# Patient Record
Sex: Female | Born: 1995 | Race: Black or African American | Hispanic: No | Marital: Single | Smoking: Former smoker
Health system: Southern US, Community
[De-identification: ages and names within clinical notes are randomized; demographics above are authoritative.]

## PROBLEM LIST (undated history)

## (undated) ENCOUNTER — Inpatient Hospital Stay (HOSPITAL_COMMUNITY): Payer: Self-pay

## (undated) ENCOUNTER — Ambulatory Visit: Admission: EM | Payer: Self-pay

## (undated) DIAGNOSIS — F419 Anxiety disorder, unspecified: Secondary | ICD-10-CM

## (undated) DIAGNOSIS — L309 Dermatitis, unspecified: Secondary | ICD-10-CM

## (undated) DIAGNOSIS — F32A Depression, unspecified: Secondary | ICD-10-CM

## (undated) DIAGNOSIS — Z789 Other specified health status: Secondary | ICD-10-CM

## (undated) DIAGNOSIS — N390 Urinary tract infection, site not specified: Secondary | ICD-10-CM

## (undated) HISTORY — PX: NO PAST SURGERIES: SHX2092

## (undated) HISTORY — PX: THERAPEUTIC ABORTION: SHX798

---
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
## 2013-01-04 ENCOUNTER — Telehealth: Payer: Self-pay | Admitting: *Deleted

## 2013-01-04 NOTE — Telephone Encounter (Signed)
Patient left message that she has questions about her mirena birth control.

## 2013-01-05 NOTE — Telephone Encounter (Signed)
Called [facility] and heard message the number you have dialed is not in service

## 2013-01-08 NOTE — Telephone Encounter (Signed)
Called patient and heard that her phone is not in service.

## 2013-02-14 ENCOUNTER — Ambulatory Visit: Payer: Self-pay | Admitting: Obstetrics & Gynecology

## 2013-12-03 ENCOUNTER — Encounter: Payer: Self-pay | Admitting: Obstetrics & Gynecology

## 2014-04-25 ENCOUNTER — Emergency Department (HOSPITAL_COMMUNITY)
Admission: EM | Admit: 2014-04-25 | Discharge: 2014-04-25 | Disposition: A | Discharge status: Home / Self Care | Payer: Medicaid Other | Attending: Emergency Medicine | Admitting: Emergency Medicine

## 2014-04-25 ENCOUNTER — Encounter (HOSPITAL_COMMUNITY): Payer: Self-pay

## 2014-04-25 DIAGNOSIS — Y998 Other external cause status: Secondary | ICD-10-CM | POA: Insufficient documentation

## 2014-04-25 DIAGNOSIS — S51812A Laceration without foreign body of left forearm, initial encounter: Secondary | ICD-10-CM

## 2014-04-25 DIAGNOSIS — Y9289 Other specified places as the place of occurrence of the external cause: Secondary | ICD-10-CM | POA: Insufficient documentation

## 2014-04-25 DIAGNOSIS — Y9389 Activity, other specified: Secondary | ICD-10-CM | POA: Insufficient documentation

## 2014-04-25 DIAGNOSIS — Z87891 Personal history of nicotine dependence: Secondary | ICD-10-CM | POA: Insufficient documentation

## 2014-04-25 DIAGNOSIS — Y288XXA Contact with other sharp object, undetermined intent, initial encounter: Secondary | ICD-10-CM | POA: Insufficient documentation

## 2014-04-25 MED ORDER — BACITRACIN ZINC 500 UNIT/GM EX OINT
TOPICAL_OINTMENT | CUTANEOUS | Status: AC
  Administered 2014-04-25: 1 via TOPICAL
  Filled 2014-04-25: qty 0.9

## 2014-04-25 MED ORDER — BACITRACIN ZINC 500 UNIT/GM EX OINT: 1.0000 "application " | TOPICAL_OINTMENT | Freq: Once | CUTANEOUS | Status: AC

## 2014-04-25 MED ORDER — LIDOCAINE-EPINEPHRINE 2 %-1:100000 IJ SOLN
20.0000 mL | Freq: Once | INTRAMUSCULAR | Status: AC
  Administered 2014-04-25: 20 mL via INTRADERMAL
  Filled 2014-04-25: qty 1

## 2014-04-25 NOTE — ED Notes (Signed)
Patient reports the spring from the mattress cut her left forearm.

## 2014-04-25 NOTE — ED Provider Notes (Signed)
CSN: [id number]     Arrival date & time 04/25/14  0414 History   First MD Initiated Contact with Patient 04/25/14 0434     Chief Complaint  Patient presents with  . Extremity Laceration     (Consider location/radiation/quality/duration/timing/severity/associated sxs/prior Treatment) HPI Comments: She states she rolled over in bed where there was a mattress spring protruding from the surface causing a laceration to her left arm. No other injury.  Patient is a 19 y.o. female presenting with skin laceration. The history is provided by the patient. No language interpreter was used.  Laceration Location:  Shoulder/arm Shoulder/arm laceration location:  L forearm Length (cm):  3 Depth:  Through dermis   Past Medical History  Diagnosis Date  . Medical history non-contributory    Past Surgical History  Procedure Laterality Date  . No past surgeries     Family History  Problem Relation Age of Onset  . Hypertension Father   . Hypertension Mother   . Diabetes Mother   . ADD / ADHD Brother   . Bipolar disorder Brother   . Schizophrenia Brother   . Anxiety disorder Sister    History  Substance Use Topics  . Smoking status: Former Games developer  . Smokeless tobacco: Not on file  . Alcohol Use: No     Comment: previous   OB History    Gravida Para Term Preterm AB TAB SAB Ectopic Multiple Living   Review of Systems  Constitutional: Negative.   Musculoskeletal: Negative.   Skin: Positive for wound.       C/O laceration.  Neurological: Negative.       Allergies  Review of patient's allergies indicates no known allergies.  Home Medications   Prior to Admission medications   Medication Sig Start Date End Date Taking? Authorizing Provider  acetaminophen (TYLENOL) 325 MG tablet Take 650 mg by mouth every 6 (six) hours as needed for pain.   Yes Historical Provider, MD   BP 119/64 mmHg  Pulse 62  Temp(Src) 98.1 F (36.7 C) (Oral)  Resp 18  Ht  (1.702  m)  Wt 215 lb (97.523 kg)  BMI 33.67 kg/m2  SpO2 100%  LMP 04/17/2014 (Approximate)  Breastfeeding? No Physical Exam  Constitutional: She is oriented to person, place, and time. She appears well-developed and well-nourished. No distress.  Musculoskeletal: Normal range of motion.  Neurological: She is alert and oriented to person, place, and time.  Skin:  3 cm laceration dorsal and proximal forearm. No active bleeding. No associated swelling.   Psychiatric: She has a normal mood and affect.    ED Course  Procedures (including critical care time) Labs Review Labs Reviewed - No data to display  Imaging Review No results found.   EKG Interpretation None     LACERATION REPAIR Performed by: Elpidio Anis A Authorized by: Elpidio Anis A Consent: Verbal consent obtained. Risks and benefits: risks, benefits and alternatives were discussed Consent given by: patient Patient identity confirmed: provided demographic data Prepped and Draped in normal sterile fashion Wound explored  Laceration Location: left forearm  Laceration Length: 3 cm  No Foreign Bodies seen or palpated  Anesthesia: local infiltration  Local anesthetic: lidocaine 2% w/epinephrine  Anesthetic total: 2 ml  Irrigation method: syringe Amount of cleaning: standard  Skin closure: staples  Number of sutures: 5  Technique: staples  Patient tolerance: Patient tolerated the procedure well with no immediate complications.  MDM   Final diagnoses:  None    1. Forearm laceration  Uncomplicated laceration repaired per procedure note.     Elpidio AnisShari Kyiah Canepa, PA-C 04/25/14 0543  Layla MawKristen N Ward, DO 04/25/14 [phone]

## 2014-04-25 NOTE — Discharge Instructions (Signed)
Staple Care and Removal °Your caregiver has used staples today to repair your wound. Staples are used to help a wound heal faster by holding the edges of the wound together. The staples can be removed when the wound has healed well enough to stay together after the staples are removed. A dressing (wound covering), depending on the location of the wound, may have been applied. This may be changed once per day or as instructed. If the dressing sticks, it may be soaked off with soapy water or hydrogen peroxide. °Only take over-the-counter or prescription medicines for pain, discomfort, or fever as directed by your caregiver.  °If you did not receive a tetanus shot today because you did not recall when your last one was given, check with your caregiver when you have your staples removed to determine if one is needed. °Return to your caregiver's office in 1 week or as suggested to have your staples removed. °SEEK IMMEDIATE MEDICAL CARE IF:  °· You have redness, swelling, or increasing pain in the wound. °· You have pus coming from the wound. °· You have a fever. °· You notice a bad smell coming from the wound or dressing. °· Your wound edges break open after staples have been removed. °Document Released: 10/13/2000 Document Revised: 04/12/2011 Document Reviewed: 10/28/2004 °ExitCare® Patient Information ©2015 [employer]. This information is not intended to replace advice given to you by your health care provider. Make sure you discuss any questions you have with your health care provider. ° °

## 2014-04-25 NOTE — ED Notes (Signed)
Suture cart at bedside 

## 2014-05-06 ENCOUNTER — Emergency Department (HOSPITAL_COMMUNITY)
Admission: EM | Admit: 2014-05-06 | Discharge: 2014-05-06 | Discharge status: Against Medical Advice | Payer: Medicaid Other | Attending: Emergency Medicine | Admitting: Emergency Medicine

## 2014-05-06 ENCOUNTER — Encounter (HOSPITAL_COMMUNITY): Payer: Self-pay | Admitting: Emergency Medicine

## 2014-05-06 DIAGNOSIS — Z4802 Encounter for removal of sutures: Secondary | ICD-10-CM | POA: Insufficient documentation

## 2014-05-06 NOTE — ED Notes (Signed)
Final call for patient with no response from lobby.

## 2014-05-06 NOTE — ED Notes (Signed)
No answer x two when called from lobby.

## 2014-05-06 NOTE — ED Notes (Signed)
Pt here for staple removal in left forearm. Pt had 5 staples placed 10 days ago. Denies any complications. Site is clean, dry, intact.

## 2014-05-06 NOTE — ED Notes (Signed)
No answer from patient x one when called from lobby.

## 2014-05-11 ENCOUNTER — Emergency Department (HOSPITAL_COMMUNITY)
Admission: EM | Admit: 2014-05-11 | Discharge: 2014-05-11 | Disposition: A | Discharge status: Home / Self Care | Payer: Medicaid Other | Attending: Emergency Medicine | Admitting: Emergency Medicine

## 2014-05-11 ENCOUNTER — Encounter (HOSPITAL_COMMUNITY): Payer: Self-pay | Admitting: Emergency Medicine

## 2014-05-11 DIAGNOSIS — Z87891 Personal history of nicotine dependence: Secondary | ICD-10-CM | POA: Insufficient documentation

## 2014-05-11 DIAGNOSIS — F419 Anxiety disorder, unspecified: Secondary | ICD-10-CM | POA: Insufficient documentation

## 2014-05-11 DIAGNOSIS — Z4802 Encounter for removal of sutures: Secondary | ICD-10-CM | POA: Insufficient documentation

## 2014-05-11 NOTE — Discharge Instructions (Signed)

## 2014-05-11 NOTE — ED Notes (Signed)
Pt need staples removed from left forearm. Pt reports staples were placed 04/25/14.

## 2014-05-11 NOTE — ED Provider Notes (Signed)
CSN: [id number]     Arrival date & time 05/11/14  1145 History   First MD Initiated Contact with Patient 05/11/14 1250     Chief Complaint  Patient presents with  . Suture / Staple Removal     (Consider location/radiation/quality/duration/timing/severity/associated sxs/prior Treatment) Patient is a 19 y.o. female presenting with suture removal. The history is provided by the patient.  Suture / Staple Removal This is a new problem. The current episode started 1 to 4 weeks ago. The problem has been gradually improving. Pertinent negatives include no fever, nausea or numbness. Nothing aggravates the symptoms. Treatments tried: daily dressings. The treatment provided significant relief.    Past Medical History  Diagnosis Date  . Medical history non-contributory    Past Surgical History  Procedure Laterality Date  . No past surgeries     Family History  Problem Relation Age of Onset  . Hypertension Father   . Hypertension Mother   . Diabetes Mother   . ADD / ADHD Brother   . Bipolar disorder Brother   . Schizophrenia Brother   . Anxiety disorder Sister    History  Substance Use Topics  . Smoking status: Former Games developermoker  . Smokeless tobacco: Not on file  . Alcohol Use: No     Comment: previous   OB History    Gravida Para Term Preterm AB TAB SAB Ectopic Multiple Living   1 1 1       1      Review of Systems  Constitutional: Negative for fever.  Gastrointestinal: Negative for nausea.  Neurological: Negative for numbness.  Psychiatric/Behavioral: The patient is nervous/anxious.        Bipolar disorder  All other systems reviewed and are negative.     Allergies  Review of patient's allergies indicates no known allergies.  Home Medications   Prior to Admission medications   Medication Sig Start Date End Date Taking? Authorizing Provider  acetaminophen (TYLENOL) 325 MG tablet Take 650 mg by mouth every 6 (six) hours as needed for pain.    Historical Provider, MD   BP  130/80 mmHg  Pulse 74  Temp(Src) 97.4 F (36.3 C) (Oral)  Resp 18  Ht 5\' 7"  (1.702 m)  Wt 215 lb (97.523 kg)  BMI 33.67 kg/m2  SpO2 97%  LMP 04/17/2014 (Approximate)  Breastfeeding? No Physical Exam  Musculoskeletal:       Left forearm: She exhibits no tenderness, no bony tenderness and no swelling.       Arms:   ED Course  Procedures (including critical care time) Labs Review Labs Reviewed - No data to display  Imaging Review No results found.   EKG Interpretation None      MDM  There is a well-healed laceration of the left forearm. No motor sensory deficits appreciated. The staples were removed without problem. The patient is advised to see the primary physician or return to the emergency department if any changes or problems.    Final diagnoses:  Encounter for staple removal    *I have reviewed nursing notes, vital signs, and all appropriate lab and imaging results for this patient.    Ivery QualeHobson Kaedyn Polivka, PA-C 05/11/14 1300  Bethann BerkshireJoseph Zammit, MD 05/11/14 1409

## 2014-06-16 ENCOUNTER — Emergency Department (HOSPITAL_COMMUNITY)
Admission: EM | Admit: 2014-06-16 | Discharge: 2014-06-17 | Disposition: A | Discharge status: Home / Self Care | Payer: Medicaid Other | Attending: Emergency Medicine | Admitting: Emergency Medicine

## 2014-06-16 ENCOUNTER — Encounter (HOSPITAL_COMMUNITY): Payer: Self-pay | Admitting: *Deleted

## 2014-06-16 DIAGNOSIS — T8389XA Other specified complication of genitourinary prosthetic devices, implants and grafts, initial encounter: Secondary | ICD-10-CM

## 2014-06-16 DIAGNOSIS — Z3202 Encounter for pregnancy test, result negative: Secondary | ICD-10-CM | POA: Insufficient documentation

## 2014-06-16 DIAGNOSIS — Y848 Other medical procedures as the cause of abnormal reaction of the patient, or of later complication, without mention of misadventure at the time of the procedure: Secondary | ICD-10-CM | POA: Insufficient documentation

## 2014-06-16 DIAGNOSIS — Z720 Tobacco use: Secondary | ICD-10-CM | POA: Insufficient documentation

## 2014-06-16 DIAGNOSIS — T8339XA Other mechanical complication of intrauterine contraceptive device, initial encounter: Secondary | ICD-10-CM | POA: Insufficient documentation

## 2014-06-16 NOTE — ED Notes (Signed)
Pt c/o abdominal pain; pt states while she was using the bathroom her IUD fell out and landed in the toilet; pt c/o vaginal pain

## 2014-06-16 NOTE — ED Provider Notes (Addendum)
CSN: [id number]     Arrival date & time 06/16/14  2306 History  This chart was scribed for Devoria Albe, MD by [place] Peace, ED Scribe. The patient was seen in [facility]. The patient's care was started at 12:25 AM.    Chief Complaint  Patient presents with  . Abdominal Pain     Patient is a 19 y.o. female presenting with abdominal pain. The history is provided by the patient. No language interpreter was used.  Abdominal Pain Associated symptoms: no nausea, no vaginal bleeding, no vaginal discharge and no vomiting   HPI Comments: Maureen Morris is a 19 y.o. Female, P2G1Ab1 who presents to the Emergency Department complaining of lower abdominal pain and vaginal pain onset 2 hrs that started immediately after her IUD that has been in place for almost 2 years, fell out while she was using the bathroom. She states she is unaware of the string and never checks for it. She denies pulling the IUD out herself. She denies any vaginal bleeding or vaginal discharge. Pt reports she took a pregnancy test yesterday that was positive. She notes she usually has periods about once every other month, last period was about 3 months ago. She explains that she has been experiencing weird cravings and some soreness to her breasts which is why she took the pregnancy test. No complaints of nausea or vomiting.  Pt is current smoker (0.5 packs per day).    PCP none GYN none  Past Medical History  Diagnosis Date  . Medical history non-contributory    Past Surgical History  Procedure Laterality Date  . No past surgeries     Family History  Problem Relation Age of Onset  . Hypertension Father   . Hypertension Mother   . Diabetes Mother   . ADD / ADHD Brother   . Bipolar disorder Brother   . Schizophrenia Brother   . Anxiety disorder Sister    History  Substance Use Topics  . Smoking status: Current Every Day Smoker -- 0.50 packs/day  . Smokeless tobacco: Not on file  . Alcohol Use: Yes     Comment:  occasionally   employed  OB History    Gravida Para Term Preterm AB TAB SAB Ectopic Multiple Living   Review of Systems  Gastrointestinal: Positive for abdominal pain. Negative for nausea and vomiting.  Genitourinary: Negative for vaginal bleeding and vaginal discharge.  All other systems reviewed and are negative.     Allergies  Review of patient's allergies indicates no known allergies.  Home Medications   Prior to Admission medications   Medication Sig Start Date End Date Taking? Authorizing Provider  acetaminophen (TYLENOL) 325 MG tablet Take 650 mg by mouth every 6 (six) hours as needed for pain.    Historical Provider, MD   BP 136/84 mmHg  Pulse 88  Temp(Src) 98.4 F (36.9 C) (Oral)  Resp 18  Ht  (1.702 m)  Wt 235 lb 1.6 oz (106.641 kg)  BMI 36.81 kg/m2  SpO2 100%  Vital signs normal   Physical Exam  Constitutional: She is oriented to person, place, and time. She appears well-developed and well-nourished.  Non-toxic appearance. She does not appear ill. No distress.  HENT:  Head: Normocephalic and atraumatic.  Right Ear: External ear normal.  Left Ear: External ear normal.  Nose: Nose normal. No mucosal edema or rhinorrhea.  Mouth/Throat: Oropharynx is clear and moist and mucous  membranes are normal. No dental abscesses or uvula swelling.  Eyes: Conjunctivae and EOM are normal. Pupils are equal, round, and reactive to light.  Neck: Normal range of motion and full passive range of motion without pain. Neck supple.  Cardiovascular: Normal rate, regular rhythm and normal heart sounds.  Exam reveals no gallop and no friction rub.   No murmur heard. Pulmonary/Chest: Effort normal and breath sounds normal. No respiratory distress. She has no wheezes. She has no rhonchi. She has no rales. She exhibits no tenderness and no crepitus.  Abdominal: Soft. Normal appearance and bowel sounds are normal. She exhibits no distension. There is tenderness.  There is no rebound and no guarding.  Tenderness to upper abdomen all way across and down the right side. Tenderness to suprapubic area.   Genitourinary:  Normal external genitalia Uterus is tender to palpation, unable to assess size to the abdominal wall thickness, tenderness over the right adnexal area without obvious masses, less tender over the left adnexa without masses.  Musculoskeletal: Normal range of motion. She exhibits no edema or tenderness.  Moves all extremities well.   Neurological: She is alert and oriented to person, place, and time. She has normal strength. No cranial nerve deficit.  Skin: Skin is warm, dry and intact. No rash noted. No erythema. No pallor.  Psychiatric: She has a normal mood and affect. Her speech is normal and behavior is normal. Her mood appears not anxious.  Nursing note and vitals reviewed.   ED Course  Procedures (including critical care time) Medications  acetaminophen (TYLENOL) tablet 1,000 mg (1,000 mg Oral Given 06/17/14 0146)   Review of patient's records show she is B+  Pt has the intact IUD with string with her.   12:31 AM- Treatment plan was discussed with patient who verbalizes understanding and agrees  Tylenol made to patient's pain better.  Although patient had a positive pregnancy test yesterday at home her urine and blood pregnancy test here tonight are negative. I had initially ordered a emergency pelvic ultrasound however it was canceled.  Patient was given her test results. Her pain was improved. Patient advised to use precautions until she can get definitive birth control again. Patient is upset because an IUD is not going to be inserted in the ED. She was given [facility].    Labs Review Results for orders placed or performed during the hospital encounter of 06/16/14  Wet prep, genital  Result Value Ref Range   Yeast Wet Prep HPF POC NONE SEEN NONE SEEN   Trich, Wet Prep NONE SEEN NONE SEEN   Clue Cells Wet  Prep HPF POC NONE SEEN NONE SEEN   WBC, Wet Prep HPF POC RARE (A) NONE SEEN  Pregnancy, urine  Result Value Ref Range   Preg Test, Ur NEGATIVE NEGATIVE  Urinalysis, Routine w reflex microscopic  Result Value Ref Range   Color, Urine YELLOW YELLOW   APPearance CLEAR CLEAR   Specific Gravity, Urine 1.025 1.005 - 1.030   pH 6.0 5.0 - 8.0   Glucose, UA NEGATIVE NEGATIVE mg/dL   Hgb urine dipstick NEGATIVE NEGATIVE   Bilirubin Urine NEGATIVE NEGATIVE   Ketones, ur NEGATIVE NEGATIVE mg/dL   Protein, ur NEGATIVE NEGATIVE mg/dL   Urobilinogen, UA 0.2 0.0 - 1.0 mg/dL   Nitrite NEGATIVE NEGATIVE   Leukocytes, UA NEGATIVE NEGATIVE  hCG, quantitative, pregnancy  Result Value Ref Range   hCG, Beta Chain, Quant, S <1 <5 mIU/mL  Imaging Review No results found.   EKG Interpretation None       MDM   Final diagnoses:  IUD complication, initial encounter   Plan discharge  Devoria AlbeIva Franshesca Chipman, MD, FACEP   I personally performed the services described in this documentation, which was scribed in my presence. The recorded information has been reviewed and considered.  Devoria AlbeIva Sumayyah Custodio, MD, Concha PyoFACEP    Elizabelle Fite, MD 06/17/14 [id number]  Devoria AlbeIva Verleen Stuckey, MD 06/17/14 2306

## 2014-06-17 LAB — URINALYSIS, ROUTINE W REFLEX MICROSCOPIC
Bilirubin Urine: NEGATIVE
GLUCOSE, UA: NEGATIVE mg/dL
HGB URINE DIPSTICK: NEGATIVE
KETONES UR: NEGATIVE mg/dL
Leukocytes, UA: NEGATIVE
Nitrite: NEGATIVE
PH: 6 (ref 5.0–8.0)
PROTEIN: NEGATIVE mg/dL
Specific Gravity, Urine: 1.025 (ref 1.005–1.030)
Urobilinogen, UA: 0.2 mg/dL (ref 0.0–1.0)

## 2014-06-17 LAB — WET PREP, GENITAL
Clue Cells Wet Prep HPF POC: NONE SEEN
TRICH WET PREP: NONE SEEN
Yeast Wet Prep HPF POC: NONE SEEN

## 2014-06-17 LAB — PREGNANCY, URINE: PREG TEST UR: NEGATIVE

## 2014-06-17 LAB — HCG, QUANTITATIVE, PREGNANCY

## 2014-06-17 MED ORDER — ACETAMINOPHEN 500 MG PO TABS
1000.0000 mg | ORAL_TABLET | Freq: Once | ORAL | Status: AC
  Administered 2014-06-17: 1000 mg via ORAL
  Filled 2014-06-17: qty 2

## 2014-06-17 NOTE — Discharge Instructions (Signed)
You can take acetaminophen 1000 mg and/or motrin 600 mg 4 times a day for pain if needed. Call [facility] to get back on birth control. USE PROTECTION UNTIL YOU GET DEFINITIVE BIRTH CONTROL!!!

## 2014-06-18 LAB — RPR: RPR: NONREACTIVE

## 2014-06-18 LAB — HIV ANTIBODY (ROUTINE TESTING W REFLEX): HIV Screen 4th Generation wRfx: NONREACTIVE

## 2014-06-18 LAB — GC/CHLAMYDIA PROBE AMP (~~LOC~~) NOT AT ARMC
CHLAMYDIA, DNA PROBE: NEGATIVE
Neisseria Gonorrhea: NEGATIVE

## 2014-11-21 ENCOUNTER — Inpatient Hospital Stay (HOSPITAL_COMMUNITY)
Admission: AD | Admit: 2014-11-21 | Discharge: 2014-11-21 | Disposition: A | Discharge status: Home / Self Care | Payer: Self-pay | Source: Ambulatory Visit | Attending: Obstetrics & Gynecology | Admitting: Obstetrics & Gynecology

## 2014-11-21 ENCOUNTER — Encounter (HOSPITAL_COMMUNITY): Payer: Self-pay | Admitting: *Deleted

## 2014-11-21 DIAGNOSIS — Z87891 Personal history of nicotine dependence: Secondary | ICD-10-CM | POA: Insufficient documentation

## 2014-11-21 DIAGNOSIS — N926 Irregular menstruation, unspecified: Secondary | ICD-10-CM

## 2014-11-21 DIAGNOSIS — N946 Dysmenorrhea, unspecified: Secondary | ICD-10-CM | POA: Insufficient documentation

## 2014-11-21 LAB — URINE MICROSCOPIC-ADD ON

## 2014-11-21 LAB — CBC WITH DIFFERENTIAL/PLATELET
Basophils Absolute: 0 10*3/uL (ref 0.0–0.1)
Basophils Relative: 1 %
EOS ABS: 0.1 10*3/uL (ref 0.0–0.7)
Eosinophils Relative: 3 %
HEMATOCRIT: 36 % (ref 36.0–46.0)
Hemoglobin: 12 g/dL (ref 12.0–15.0)
LYMPHS ABS: 2 10*3/uL (ref 0.7–4.0)
LYMPHS PCT: 43 %
MCH: 24.7 pg — AB (ref 26.0–34.0)
MCHC: 33.3 g/dL (ref 30.0–36.0)
MCV: 74.2 fL — AB (ref 78.0–100.0)
MONOS PCT: 11 %
Monocytes Absolute: 0.5 10*3/uL (ref 0.1–1.0)
NEUTROS ABS: 2 10*3/uL (ref 1.7–7.7)
Neutrophils Relative %: 43 %
Platelets: 299 10*3/uL (ref 150–400)
RBC: 4.85 MIL/uL (ref 3.87–5.11)
RDW: 14.7 % (ref 11.5–15.5)
WBC: 4.6 10*3/uL (ref 4.0–10.5)

## 2014-11-21 LAB — URINALYSIS, ROUTINE W REFLEX MICROSCOPIC
Bilirubin Urine: NEGATIVE
GLUCOSE, UA: NEGATIVE mg/dL
KETONES UR: NEGATIVE mg/dL
LEUKOCYTES UA: NEGATIVE
NITRITE: NEGATIVE
Protein, ur: NEGATIVE mg/dL
SPECIFIC GRAVITY, URINE: 1.025 (ref 1.005–1.030)
Urobilinogen, UA: 0.2 mg/dL (ref 0.0–1.0)
pH: 6 (ref 5.0–8.0)

## 2014-11-21 LAB — WET PREP, GENITAL
CLUE CELLS WET PREP: NONE SEEN
Trich, Wet Prep: NONE SEEN
Yeast Wet Prep HPF POC: NONE SEEN

## 2014-11-21 LAB — HCG, QUANTITATIVE, PREGNANCY

## 2014-11-21 LAB — POCT PREGNANCY, URINE: Preg Test, Ur: NEGATIVE

## 2014-11-21 NOTE — MAU Provider Note (Signed)
History     CSN: [id number]  Arrival date and time: 11/21/14 1311   First Provider Initiated Contact with Patient 11/21/14 1347      Chief Complaint  Patient presents with  . Vaginal Bleeding   HPI [name] is a 19 y.o. G1P1001 who presents to MAU today with complaint of vaginal bleeding and abdominal pain since this morning. She states that she woke up and had soaked through her shorts with blood. Since onset bleeding has lightened significantly and she only notes with wiping now. She states that she has been having sharp pains in the lower abdomen intermittently since this morning. She rates her pain at 7/10 now and has not taken anything for pain. She denies fever or UTI symptoms. She states multiple +HPTs on Monday and LMP 10/19/14.   OB History    Gravida Para Term Preterm AB TAB SAB Ectopic Multiple Living   Past Medical History  Diagnosis Date  . Medical history non-contributory     Past Surgical History  Procedure Laterality Date  . No past surgeries      Family History  Problem Relation Age of Onset  . Hypertension Father   . Hypertension Mother   . Diabetes Mother   . ADD / ADHD Brother   . Bipolar disorder Brother   . Schizophrenia Brother   . Anxiety disorder Sister     Social History  Substance Use Topics  . Smoking status: Former Smoker -- 0.50 packs/day    Quit date: 11/02/2014  . Smokeless tobacco: None  . Alcohol Use: Yes     Comment: occasionally    Allergies: No Known Allergies  Prescriptions prior to admission  Medication Sig Dispense Refill Last Dose  . acetaminophen (TYLENOL) 325 MG tablet Take 650 mg by mouth every 6 (six) hours as needed for pain.   11/14/2014    Review of Systems  Constitutional: Negative for fever and malaise/fatigue.  Gastrointestinal: Positive for abdominal pain. Negative for nausea, vomiting, diarrhea and constipation.  Genitourinary: Negative for dysuria, urgency and  frequency.       + vaginal bleeding   Physical Exam   Blood pressure 112/71, pulse 53, temperature 98.3 F (36.8 C), resp. rate 18, height  (1.702 m), weight 227 lb 3.2 oz (103.057 kg), last menstrual period 10/19/2014, SpO2 99 %.  Physical Exam  Nursing note and vitals reviewed. Constitutional: She is oriented to person, place, and time. She appears well-developed and well-nourished. No distress.  HENT:  Head: Normocephalic and atraumatic.  Cardiovascular: Normal rate.   Respiratory: Effort normal.  GI: Soft. She exhibits no distension and no mass. There is tenderness (mild tenderness to palpation of the suprapubic region). There is no rebound and no guarding.  Genitourinary: Uterus is tender (mild). Uterus is not enlarged. Cervix exhibits no motion tenderness, no discharge and no friability. Right adnexum displays tenderness (mild). Right adnexum displays no mass. Left adnexum displays no mass and no tenderness. There is bleeding (small blood in the vaginal vault) in the vagina. No vaginal discharge found.  Neurological: She is alert and oriented to person, place, and time.  Skin: Skin is warm and dry. No erythema.  Psychiatric: She has a normal mood and affect.   Results for orders placed or performed during the hospital encounter of 11/21/14 (from the past 24 hour(s))  Urinalysis, Routine w reflex microscopic (not at [facility])  Status: Abnormal   Collection Time: 11/21/14  1:35 PM  Result Value Ref Range   Color, Urine YELLOW YELLOW   APPearance CLEAR CLEAR   Specific Gravity, Urine 1.025 1.005 - 1.030   pH 6.0 5.0 - 8.0   Glucose, UA NEGATIVE NEGATIVE mg/dL   Hgb urine dipstick MODERATE (A) NEGATIVE   Bilirubin Urine NEGATIVE NEGATIVE   Ketones, ur NEGATIVE NEGATIVE mg/dL   Protein, ur NEGATIVE NEGATIVE mg/dL   Urobilinogen, UA 0.2 0.0 - 1.0 mg/dL   Nitrite NEGATIVE NEGATIVE   Leukocytes, UA NEGATIVE NEGATIVE  Urine microscopic-add on     Status: Abnormal   Collection  Time: 11/21/14  1:35 PM  Result Value Ref Range   Squamous Epithelial / LPF FEW (A) RARE   WBC, UA 0-2 <3 WBC/hpf   RBC / HPF 0-2 <3 RBC/hpf   Bacteria, UA RARE RARE  Pregnancy, urine POC     Status: None   Collection Time: 11/21/14  1:41 PM  Result Value Ref Range   Preg Test, Ur NEGATIVE NEGATIVE  CBC with Differential/Platelet     Status: Abnormal   Collection Time: 11/21/14  1:55 PM  Result Value Ref Range   WBC 4.6 4.0 - 10.5 K/uL   RBC 4.85 3.87 - 5.11 MIL/uL   Hemoglobin 12.0 12.0 - 15.0 g/dL   HCT 16.136.0 09.636.0 - 04.546.0 %   MCV 74.2 (L) 78.0 - 100.0 fL   MCH 24.7 (L) 26.0 - 34.0 pg   MCHC 33.3 30.0 - 36.0 g/dL   RDW 40.914.7 81.111.5 - 91.415.5 %   Platelets 299 150 - 400 K/uL   Neutrophils Relative % 43 %   Neutro Abs 2.0 1.7 - 7.7 K/uL   Lymphocytes Relative 43 %   Lymphs Abs 2.0 0.7 - 4.0 K/uL   Monocytes Relative 11 %   Monocytes Absolute 0.5 0.1 - 1.0 K/uL   Eosinophils Relative 3 %   Eosinophils Absolute 0.1 0.0 - 0.7 K/uL   Basophils Relative 1 %   Basophils Absolute 0.0 0.0 - 0.1 K/uL  hCG, quantitative, pregnancy     Status: None   Collection Time: 11/21/14  1:55 PM  Result Value Ref Range   hCG, Beta Chain, Quant, S <1 <5 mIU/mL  Wet prep, genital     Status: Abnormal   Collection Time: 11/21/14  2:05 PM  Result Value Ref Range   Yeast Wet Prep HPF POC NONE SEEN NONE SEEN   Trich, Wet Prep NONE SEEN NONE SEEN   Clue Cells Wet Prep HPF POC NONE SEEN NONE SEEN   WBC, Wet Prep HPF POC FEW (A) NONE SEEN    MAU Course  Procedures None  MDM UPT - negative UA, wet prep, GC/chlamydia, CBC, quant hCG, HIV, RPR ordered to confirm due to +HPT reported Quant hCG confirmed negative pregnancy  Assessment and Plan  A: Menses Dysmenorrhea  P: Discharge home Ibuprofen PRN for pain advised Bleeding precautions discussed Patient advised to follow-up with PCP of choice as needed Patient may return to MAU as needed or if her condition were to change or worsen  Marny LowensteinJulie N  Jill Ruppe, PA-C  11/21/2014, 3:00 PM

## 2014-11-21 NOTE — Discharge Instructions (Signed)
Dysmenorrhea Dysmenorrhea is pain during a menstrual period. You will have pain in the lower belly (abdomen). The pain is caused by the tightening (contracting) of the muscles of the uterus. The pain can be minor or severe. Headache, feeling sick to your stomach (nausea), throwing up (vomiting), or low back pain may occur with this condition. HOME CARE  Only take medicine as told by your doctor.  Place a heating pad or hot water bottle on your lower back or belly. Do not sleep with a heating pad.  Exercise may help lessen the pain.  Massage the lower back or belly.  Stop smoking.  Avoid alcohol and caffeine. GET HELP IF:   Your pain does not get better with medicine.  You have pain during sex.  Your pain gets worse while taking pain medicine.  Your period bleeding is heavier than normal.  You keep feeling sick to your stomach or keep throwing up. GET HELP RIGHT AWAY IF: You pass out (faint).   This information is not intended to replace advice given to you by your health care provider. Make sure you discuss any questions you have with your health care provider.   Document Released: 04/16/2008 Document Revised: 01/23/2013 Document Reviewed: 07/06/2012 Elsevier Interactive Patient Education 2016 [employer]  

## 2014-11-21 NOTE — MAU Note (Signed)
Pt reports she had a positive HPT last week. Started bleeding today with some cramping.

## 2014-11-22 LAB — HIV ANTIBODY (ROUTINE TESTING W REFLEX): HIV Screen 4th Generation wRfx: NONREACTIVE

## 2014-11-22 LAB — RPR: RPR Ser Ql: NONREACTIVE

## 2014-11-22 LAB — GC/CHLAMYDIA PROBE AMP (~~LOC~~) NOT AT ARMC
Chlamydia: NEGATIVE
Neisseria Gonorrhea: NEGATIVE

## 2014-12-24 ENCOUNTER — Encounter (HOSPITAL_COMMUNITY): Payer: Self-pay

## 2014-12-24 ENCOUNTER — Emergency Department (HOSPITAL_COMMUNITY)
Admission: EM | Admit: 2014-12-24 | Discharge: 2014-12-24 | Disposition: A | Discharge status: Home / Self Care | Payer: No Typology Code available for payment source | Attending: Emergency Medicine | Admitting: Emergency Medicine

## 2014-12-24 DIAGNOSIS — S299XXA Unspecified injury of thorax, initial encounter: Secondary | ICD-10-CM | POA: Insufficient documentation

## 2014-12-24 DIAGNOSIS — Y998 Other external cause status: Secondary | ICD-10-CM | POA: Diagnosis not present

## 2014-12-24 DIAGNOSIS — Y92410 Unspecified street and highway as the place of occurrence of the external cause: Secondary | ICD-10-CM | POA: Insufficient documentation

## 2014-12-24 DIAGNOSIS — Y9389 Activity, other specified: Secondary | ICD-10-CM | POA: Insufficient documentation

## 2014-12-24 DIAGNOSIS — M549 Dorsalgia, unspecified: Secondary | ICD-10-CM

## 2014-12-24 DIAGNOSIS — S3992XA Unspecified injury of lower back, initial encounter: Secondary | ICD-10-CM | POA: Insufficient documentation

## 2014-12-24 DIAGNOSIS — Z87891 Personal history of nicotine dependence: Secondary | ICD-10-CM | POA: Diagnosis not present

## 2014-12-24 DIAGNOSIS — R0789 Other chest pain: Secondary | ICD-10-CM

## 2014-12-24 MED ORDER — CYCLOBENZAPRINE HCL 10 MG PO TABS: 10.0000 mg | ORAL_TABLET | Freq: Two times a day (BID) | ORAL | Status: DC | PRN

## 2014-12-24 MED ORDER — NAPROXEN 375 MG PO TABS: 375.0000 mg | ORAL_TABLET | Freq: Two times a day (BID) | ORAL | Status: DC

## 2014-12-24 MED ORDER — IBUPROFEN 800 MG PO TABS
800.0000 mg | ORAL_TABLET | Freq: Once | ORAL | Status: AC
  Administered 2014-12-24: 800 mg via ORAL
  Filled 2014-12-24: qty 1

## 2014-12-24 NOTE — ED Notes (Signed)
Patient sitting with legs over side rail and head hanging over other side of bed, respirations even and unlabored, skin warm and dry, resting comfortably.
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
## 2014-12-24 NOTE — ED Notes (Signed)
Patient left without prescription and discharge paperwork. Ambulatory with visitors, steady gait.
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
## 2015-06-20 ENCOUNTER — Encounter (HOSPITAL_COMMUNITY): Payer: Self-pay | Admitting: *Deleted

## 2015-06-20 ENCOUNTER — Inpatient Hospital Stay (HOSPITAL_COMMUNITY)
Admission: AD | Admit: 2015-06-20 | Discharge: 2015-06-20 | Disposition: A | Discharge status: Home / Self Care | Payer: No Typology Code available for payment source | Source: Ambulatory Visit | Attending: Family Medicine | Admitting: Family Medicine

## 2015-06-20 DIAGNOSIS — Z3A08 8 weeks gestation of pregnancy: Secondary | ICD-10-CM

## 2015-06-20 DIAGNOSIS — O26899 Other specified pregnancy related conditions, unspecified trimester: Secondary | ICD-10-CM

## 2015-06-20 DIAGNOSIS — N760 Acute vaginitis: Secondary | ICD-10-CM

## 2015-06-20 DIAGNOSIS — A5901 Trichomonal vulvovaginitis: Secondary | ICD-10-CM

## 2015-06-20 DIAGNOSIS — O219 Vomiting of pregnancy, unspecified: Secondary | ICD-10-CM

## 2015-06-20 DIAGNOSIS — Z3A01 Less than 8 weeks gestation of pregnancy: Secondary | ICD-10-CM | POA: Insufficient documentation

## 2015-06-20 DIAGNOSIS — O98311 Other infections with a predominantly sexual mode of transmission complicating pregnancy, first trimester: Secondary | ICD-10-CM

## 2015-06-20 DIAGNOSIS — Z818 Family history of other mental and behavioral disorders: Secondary | ICD-10-CM | POA: Insufficient documentation

## 2015-06-20 DIAGNOSIS — R102 Pelvic and perineal pain: Secondary | ICD-10-CM | POA: Insufficient documentation

## 2015-06-20 DIAGNOSIS — Z8249 Family history of ischemic heart disease and other diseases of the circulatory system: Secondary | ICD-10-CM | POA: Insufficient documentation

## 2015-06-20 DIAGNOSIS — O26891 Other specified pregnancy related conditions, first trimester: Secondary | ICD-10-CM | POA: Insufficient documentation

## 2015-06-20 DIAGNOSIS — R1110 Vomiting, unspecified: Secondary | ICD-10-CM | POA: Insufficient documentation

## 2015-06-20 DIAGNOSIS — R109 Unspecified abdominal pain: Secondary | ICD-10-CM | POA: Insufficient documentation

## 2015-06-20 DIAGNOSIS — B9689 Other specified bacterial agents as the cause of diseases classified elsewhere: Secondary | ICD-10-CM

## 2015-06-20 DIAGNOSIS — Z833 Family history of diabetes mellitus: Secondary | ICD-10-CM | POA: Insufficient documentation

## 2015-06-20 DIAGNOSIS — Z87891 Personal history of nicotine dependence: Secondary | ICD-10-CM | POA: Insufficient documentation

## 2015-06-20 LAB — CBC WITH DIFFERENTIAL/PLATELET
BASOS ABS: 0 10*3/uL (ref 0.0–0.1)
BASOS PCT: 0 %
Eosinophils Absolute: 0.1 10*3/uL (ref 0.0–0.7)
Eosinophils Relative: 1 %
HEMATOCRIT: 35 % — AB (ref 36.0–46.0)
Hemoglobin: 11.9 g/dL — ABNORMAL LOW (ref 12.0–15.0)
Lymphocytes Relative: 37 %
Lymphs Abs: 3 10*3/uL (ref 0.7–4.0)
MCH: 24.4 pg — ABNORMAL LOW (ref 26.0–34.0)
MCHC: 34 g/dL (ref 30.0–36.0)
MCV: 71.9 fL — ABNORMAL LOW (ref 78.0–100.0)
MONO ABS: 0.6 10*3/uL (ref 0.1–1.0)
Monocytes Relative: 7 %
NEUTROS ABS: 4.3 10*3/uL (ref 1.7–7.7)
NEUTROS PCT: 54 %
Platelets: 338 10*3/uL (ref 150–400)
RBC: 4.87 MIL/uL (ref 3.87–5.11)
RDW: 14.6 % (ref 11.5–15.5)
WBC: 7.9 10*3/uL (ref 4.0–10.5)

## 2015-06-20 LAB — TYPE AND SCREEN
ABO/RH(D): B POS
ANTIBODY SCREEN: NEGATIVE

## 2015-06-20 LAB — COMPREHENSIVE METABOLIC PANEL
ALBUMIN: 3.5 g/dL (ref 3.5–5.0)
ALT: 13 U/L — ABNORMAL LOW (ref 14–54)
AST: 16 U/L (ref 15–41)
Alkaline Phosphatase: 73 U/L (ref 38–126)
Anion gap: 9 (ref 5–15)
BILIRUBIN TOTAL: 0.4 mg/dL (ref 0.3–1.2)
BUN: 8 mg/dL (ref 6–20)
CALCIUM: 8.7 mg/dL — AB (ref 8.9–10.3)
CHLORIDE: 104 mmol/L (ref 101–111)
CO2: 25 mmol/L (ref 22–32)
Creatinine, Ser: 0.8 mg/dL (ref 0.44–1.00)
GFR calc Af Amer: >60 mL/min (ref >60)
GFR calc non Af Amer: >60 mL/min (ref >60)
GLUCOSE: 92 mg/dL (ref 65–99)
POTASSIUM: 3.4 mmol/L — AB (ref 3.5–5.1)
SODIUM: 138 mmol/L (ref 135–145)
TOTAL PROTEIN: 6.9 g/dL (ref 6.5–8.1)

## 2015-06-20 LAB — URINALYSIS, ROUTINE W REFLEX MICROSCOPIC
BILIRUBIN URINE: NEGATIVE
GLUCOSE, UA: NEGATIVE mg/dL
HGB URINE DIPSTICK: NEGATIVE
KETONES UR: NEGATIVE mg/dL
NITRITE: NEGATIVE
Protein, ur: NEGATIVE mg/dL
SPECIFIC GRAVITY, URINE: 1.01 (ref 1.005–1.030)
pH: 7 (ref 5.0–8.0)

## 2015-06-20 LAB — LIPASE, BLOOD: LIPASE: 29 U/L (ref 11–51)

## 2015-06-20 LAB — WET PREP, GENITAL
SPERM: NONE SEEN
YEAST WET PREP: NONE SEEN

## 2015-06-20 LAB — URINE MICROSCOPIC-ADD ON

## 2015-06-20 LAB — HCG, QUANTITATIVE, PREGNANCY: HCG, BETA CHAIN, QUANT, S: 59777 m[IU]/mL — AB (ref <5)

## 2015-06-20 LAB — POCT PREGNANCY, URINE: Preg Test, Ur: POSITIVE — AB

## 2015-06-20 MED ORDER — PRENATAL VITAMINS 0.8 MG PO TABS: 1.0000 | ORAL_TABLET | Freq: Every day | ORAL | Status: DC

## 2015-06-20 MED ORDER — PYRIDOXINE HCL 25 MG PO TABS: 25.0000 mg | ORAL_TABLET | Freq: Four times a day (QID) | ORAL | Status: DC | PRN

## 2015-06-20 MED ORDER — METRONIDAZOLE 500 MG PO TABS: 500.0000 mg | ORAL_TABLET | Freq: Two times a day (BID) | ORAL | Status: DC

## 2015-06-20 MED ORDER — METRONIDAZOLE 500 MG PO TABS: 2000.0000 mg | ORAL_TABLET | Freq: Once | ORAL | Status: DC

## 2015-06-20 MED ORDER — DOXYLAMINE SUCCINATE (SLEEP) 25 MG PO TABS
25.0000 mg | ORAL_TABLET | Freq: Four times a day (QID) | ORAL | Status: DC | PRN
Start: 2015-06-20 — End: 2015-07-19

## 2015-06-20 NOTE — MAU Provider Note (Signed)
MAU HISTORY AND PHYSICAL  Chief Complaint:  Vaginal Pain; Amenorrhea; Abdominal Pain; and Emesis   Maureen Morris is a 20 y.o.  G2P1001 presenting for Vaginal Pain; Amenorrhea; Abdominal Pain; and Emesis  LMP some time last week of march, placing pregnancy at approximately 7 weeks.  Abdominal pain: began one week ago. Comes and goes. Diffuse and epigastric. Last experienced this morning, none currently. Occasional n/v, last yesterday. No fevers or chills or dysuria. Today decreased appetite. No lower abdominal or pelvic pain.  VAginal pain: present for 3-4 weeks. Itchy/burning pain. Increased discharge as well. No bleeding.   Past Medical History  Diagnosis Date  . Medical history non-contributory     Past Surgical History  Procedure Laterality Date  . No past surgeries      Family History  Problem Relation Age of Onset  . Hypertension Father   . Hypertension Mother   . Diabetes Mother   . ADD / ADHD Brother   . Bipolar disorder Brother   . Schizophrenia Brother   . Anxiety disorder Sister     Social History  Substance Use Topics  . Smoking status: Former Smoker -- 0.50 packs/day    Quit date: 11/02/2014  . Smokeless tobacco: None  . Alcohol Use: Yes     Comment: occasionally    No Known Allergies  Prescriptions prior to admission  Medication Sig Dispense Refill Last Dose  . cyclobenzaprine (FLEXERIL) 10 MG tablet Take 1 tablet (10 mg total) by mouth 2 (two) times daily as needed for muscle spasms. (Patient not taking: Reported on 06/20/2015) 20 tablet 0 Not Taking at Unknown time  . naproxen (NAPROSYN) 375 MG tablet Take 1 tablet (375 mg total) by mouth 2 (two) times daily. (Patient not taking: Reported on 06/20/2015) 20 tablet 0 Not Taking at Unknown time    Review of Systems - Negative except for what is mentioned in HPI.  Physical Exam  Blood pressure 127/66, pulse 52, temperature 98 F (36.7 C), temperature source Oral, resp. rate 16, height 5\' 7"  (1.702  m), weight 224 lb (101.606 kg), last menstrual period 12/24/2014. GENERAL: Well-developed, well-nourished female in no acute distress.  LUNGS: Clear to auscultation bilaterally.  HEART: Regular rate and rhythm. ABDOMEN: Soft, nontender, nondistended.  EXTREMITIES: Nontender, no edema, 2+ distal pulses. Cervical Exam: deferred   Labs: Results for orders placed or performed during the hospital encounter of 06/20/15 (from the past 24 hour(s))  Urinalysis, Routine w reflex microscopic (not at [facility])   Collection Time: 06/20/15  3:32 PM  Result Value Ref Range   Color, Urine YELLOW YELLOW   APPearance CLEAR CLEAR   Specific Gravity, Urine 1.010 1.005 - 1.030   pH 7.0 5.0 - 8.0   Glucose, UA NEGATIVE NEGATIVE mg/dL   Hgb urine dipstick NEGATIVE NEGATIVE   Bilirubin Urine NEGATIVE NEGATIVE   Ketones, ur NEGATIVE NEGATIVE mg/dL   Protein, ur NEGATIVE NEGATIVE mg/dL   Nitrite NEGATIVE NEGATIVE   Leukocytes, UA LARGE (A) NEGATIVE  Urine microscopic-add on   Collection Time: 06/20/15  3:32 PM  Result Value Ref Range   Squamous Epithelial / LPF 0-5 (A) NONE SEEN   WBC, UA 0-5 0 - 5 WBC/hpf   RBC / HPF 0-5 0 - 5 RBC/hpf   Bacteria, UA MANY (A) NONE SEEN   Urine-Other YEAST   Pregnancy, urine POC   Collection Time: 06/20/15  3:37 PM  Result Value Ref Range   Preg Test, Ur POSITIVE (A) NEGATIVE  CBC with Differential/Platelet  Collection Time: 06/20/15  4:01 PM  Result Value Ref Range   WBC 7.9 4.0 - 10.5 K/uL   RBC 4.87 3.87 - 5.11 MIL/uL   Hemoglobin 11.9 (L) 12.0 - 15.0 g/dL   HCT 40.9 (L) 81.1 - 91.4 %   MCV 71.9 (L) 78.0 - 100.0 fL   MCH 24.4 (L) 26.0 - 34.0 pg   MCHC 34.0 30.0 - 36.0 g/dL   RDW 78.2 95.6 - 21.3 %   Platelets 338 150 - 400 K/uL   Neutrophils Relative % 54 %   Neutro Abs 4.3 1.7 - 7.7 K/uL   Lymphocytes Relative 37 %   Lymphs Abs 3.0 0.7 - 4.0 K/uL   Monocytes Relative 7 %   Monocytes Absolute 0.6 0.1 - 1.0 K/uL   Eosinophils Relative 1 %   Eosinophils  Absolute 0.1 0.0 - 0.7 K/uL   Basophils Relative 0 %   Basophils Absolute 0.0 0.0 - 0.1 K/uL  Comprehensive metabolic panel   Collection Time: 06/20/15  4:01 PM  Result Value Ref Range   Sodium 138 135 - 145 mmol/L   Potassium 3.4 (L) 3.5 - 5.1 mmol/L   Chloride 104 101 - 111 mmol/L   CO2 25 22 - 32 mmol/L   Glucose, Bld 92 65 - 99 mg/dL   BUN 8 6 - 20 mg/dL   Creatinine, Ser 0.86 0.44 - 1.00 mg/dL   Calcium 8.7 (L) 8.9 - 10.3 mg/dL   Total Protein 6.9 6.5 - 8.1 g/dL   Albumin 3.5 3.5 - 5.0 g/dL   AST 16 15 - 41 U/L   ALT 13 (L) 14 - 54 U/L   Alkaline Phosphatase 73 38 - 126 U/L   Total Bilirubin 0.4 0.3 - 1.2 mg/dL   GFR calc non Af Amer >60 >60 mL/min   GFR calc Af Amer >60 >60 mL/min   Anion gap 9 5 - 15  Type and screen   Collection Time: 06/20/15  4:01 PM  Result Value Ref Range   ABO/RH(D) B POS    Antibody Screen PENDING    Sample Expiration 06/23/2015   Wet prep, genital   Collection Time: 06/20/15  4:35 PM  Result Value Ref Range   Yeast Wet Prep HPF POC NONE SEEN NONE SEEN   Trich, Wet Prep PRESENT (A) NONE SEEN   Clue Cells Wet Prep HPF POC PRESENT (A) NONE SEEN   WBC, Wet Prep HPF POC MODERATE (A) NONE SEEN   Sperm NONE SEEN     Imaging Studies:  No results found.  Assessment: Maureen Morris is  20 y.o. G2P1001 at ~ [redacted] wks gestation presents with Vaginal Pain; Amenorrhea; Abdominal Pain; and Emesis Patient is pregnant. Abdominal pain is mild and not currently present. Tolerating PO, cmp wnl, afebrile, urinalysis not suggestive of infection. No lower abdominal pain or pelvic pain or bleeding to suggest ectopic or sab. Symptoms of vaginitis, and wet prep showing trich and BV, so will treat w/ 1-week course of metronidazole.   Plan: - metronidazole 500 mg po bid for 7 days; partner not available to contact to verify name, dob, and allergies. Recommending partner treatment/testing prior to resuming intercourse, and always use of condoms. Will need toc in at  least 2 weeks. - pyridoxine and doxylamine for n/v - outpatient u/s ordered - start prenatal vitamin - [facility] low risk clinic f/u - abdominal pain return precautions - f/u gonorrhea, chlamydia, rpr, and hiv  Cherrie Gauze Shavaun Osterloh 5/19/20175:13 PM

## 2015-06-20 NOTE — Discharge Instructions (Signed)
Bacterial Vaginosis °Bacterial vaginosis is an infection of the vagina. It happens when too many germs (bacteria) grow in the vagina. Having this infection puts you at risk for getting other infections from sex. Treating this infection can help lower your risk for other infections, such as:  °· Chlamydia. °· Gonorrhea. °· HIV. °· Herpes. °HOME CARE °· Take your medicine as told by your doctor. °· Finish your medicine even if you start to feel better. °· Tell your sex partner that you have an infection. They should see their doctor for treatment. °· During treatment: °· Avoid sex or use condoms correctly. °· Do not douche. °· Do not drink alcohol unless your doctor tells you it is ok. °· Do not breastfeed unless your doctor tells you it is ok. °GET HELP IF: °· You are not getting better after 3 days of treatment. °· You have more grey fluid (discharge) coming from your vagina than before. °· You have more pain than before. °· You have a fever. °MAKE SURE YOU:  °· Understand these instructions. °· Will watch your condition. °· Will get help right away if you are not doing well or get worse. °  °This information is not intended to replace advice given to you by your health care provider. Make sure you discuss any questions you have with your health care provider. °  °Document Released: 10/28/2007 Document Revised: 02/08/2014 Document Reviewed: 08/30/2012 °Elsevier Interactive Patient Education ©2016 Elsevier Inc. °Trichomoniasis °Trichomoniasis is an infection caused by an organism called Trichomonas. The infection can affect both women and men. In women, the outer female genitalia and the vagina are affected. In men, the penis is mainly affected, but the prostate and other reproductive organs can also be involved. Trichomoniasis is a sexually transmitted infection (STI) and is most often passed to another person through sexual contact.  °RISK FACTORS °· Having unprotected sexual intercourse. °· Having sexual  intercourse with an infected partner. °SIGNS AND SYMPTOMS  °Symptoms of trichomoniasis in women include: °· Abnormal gray-green frothy vaginal discharge. °· Itching and irritation of the vagina. °· Itching and irritation of the area outside the vagina. °Symptoms of trichomoniasis in men include:  °· Penile discharge with or without pain. °· Pain during urination. This results from inflammation of the urethra. °DIAGNOSIS  °Trichomoniasis may be found during a Pap test or physical exam. Your health care provider may use one of the following methods to help diagnose this infection: °· Testing the pH of the vagina with a test tape. °· Using a vaginal swab test that checks for the Trichomonas organism. A test is available that provides results within a few minutes. °· Examining a urine sample. °· Testing vaginal secretions. °Your health care provider may test you for other STIs, including HIV. °TREATMENT  °· You may be given medicine to fight the infection. Women should inform their health care provider if they could be or are pregnant. Some medicines used to treat the infection should not be taken during pregnancy. °· Your health care provider may recommend over-the-counter medicines or creams to decrease itching or irritation. °· Your sexual partner will need to be treated if infected. °· Your health care provider may test you for infection again 3 months after treatment. °HOME CARE INSTRUCTIONS  °· Take medicines only as directed by your health care provider. °· Take over-the-counter medicine for itching or irritation as directed by your health care provider. °· Do not have sexual intercourse while you have the infection. °· Women should   not douche or wear tampons while they have the infection.  Discuss your infection with your partner. Your partner may have gotten the infection from you, or you may have gotten it from your partner.  Have your sex partner get examined and treated if necessary.  Practice safe,  informed, and protected sex.  See your health care provider for other STI testing. SEEK MEDICAL CARE IF:   You still have symptoms after you finish your medicine.  You develop abdominal pain.  You have pain when you urinate.  You have bleeding after sexual intercourse.  You develop a rash.  Your medicine makes you sick or makes you throw up (vomit). MAKE SURE YOU:  Understand these instructions.  Will watch your condition.  Will get help right away if you are not doing well or get worse.   This information is not intended to replace advice given to you by your health care provider. Make sure you discuss any questions you have with your health care provider.   Document Released: 07/14/2000 Document Revised: 02/08/2014 Document Reviewed: 10/30/2012 Elsevier Interactive Patient Education 2016 Else[employer] Inc[employer] Morning Sickness Morning sickness is when you feel sick to your stomach (nauseous) during pregnancy. This nauseous feeling may or may not come with vomiting. It often occurs in the morning but can be a problem any time of day. Morning sickness is most common during the first trimester, but it may continue throughout pregnancy. While morning sickness is unpleasant, it is usually harmless unless you develop severe and continual vomiting (hyperemesis gravidarum). This condition requires more intense treatment.  CAUSES  The cause of morning sickness is not completely known but seems to be related to normal hormonal changes that occur in pregnancy. RISK FACTORS You are at greater risk if you:  Experienced nausea or vomiting before your pregnancy.  Had morning sickness during a previous pregnancy.  Are pregnant with more than one baby, such as twins. TREATMENT  Do not use any medicines (prescription, over-the-counter, or herbal) for morning sickness without first talking to your health care provider. Your health care provider may prescribe or recommend:  Vitamin B6  supplements.  Anti-nausea medicines.  The herbal medicine ginger. HOME CARE INSTRUCTIONS   Only take over-the-counter or prescription medicines as directed by your health care provider.  Taking multivitamins before getting pregnant can prevent or decrease the severity of morning sickness in most women.  Eat a piece of dry toast or unsalted crackers before getting out of bed in the morning.  Eat five or six small meals a day.  Eat dry and bland foods (rice, baked potato). Foods high in carbohydrates are often helpful.  Do not drink liquids with your meals. Drink liquids between meals.  Avoid greasy, fatty, and spicy foods.  Get someone to cook for you if the smell of any food causes nausea and vomiting.  If you feel nauseous after taking prenatal vitamins, take the vitamins at night or with a snack.  Snack on protein foods (nuts, yogurt, cheese) between meals if you are hungry.  Eat unsweetened gelatins for desserts.  Wearing an acupressure wristband (worn for sea sickness) may be helpful.  Acupuncture may be helpful.  Do not smoke.  Get a humidifier to keep the air in your house free of odors.  Get plenty of fresh air. SEEK MEDICAL CARE IF:   Your home remedies are not working, and you need medicine.  You feel dizzy or lightheaded.  You are losing weight. SEEK IMMEDIATE MEDICAL CARE IF:  You have persistent and uncontrolled nausea and vomiting.  You pass out (faint). MAKE SURE YOU:  Understand these instructions.  Will watch your condition.  Will get help right away if you are not doing well or get worse.   This information is not intended to replace advice given to you by your health care provider. Make sure you discuss any questions you have with your health care provider.   Document Released: 03/11/2006 Document Revised: 01/23/2013 Document Reviewed: 07/05/2012 Elsevier Interactive Patient Education 2016 [employer] Abdominal Pain During  Pregnancy Abdominal pain is common in pregnancy. Most of the time, it does not cause harm. There are many causes of abdominal pain. Some causes are more serious than others. Some of the causes of abdominal pain in pregnancy are easily diagnosed. Occasionally, the diagnosis takes time to understand. Other times, the cause is not determined. Abdominal pain can be a sign that something is very wrong with the pregnancy, or the pain may have nothing to do with the pregnancy at all. For this reason, always tell your health care provider if you have any abdominal discomfort. HOME CARE INSTRUCTIONS  Monitor your abdominal pain for any changes. The following actions may help to alleviate any discomfort you are experiencing:  Do not have sexual intercourse or put anything in your vagina until your symptoms go away completely.  Get plenty of rest until your pain improves.  Drink clear fluids if you feel nauseous. Avoid solid food as long as you are uncomfortable or nauseous.  Only take over-the-counter or prescription medicine as directed by your health care provider.  Keep all follow-up appointments with your health care provider. SEEK IMMEDIATE MEDICAL CARE IF:  You are bleeding, leaking fluid, or passing tissue from the vagina.  You have increasing pain or cramping.  You have persistent vomiting.  You have painful or bloody urination.  You have a fever.  You notice a decrease in your baby's movements.  You have extreme weakness or feel faint.  You have shortness of breath, with or without abdominal pain.  You develop a severe headache with abdominal pain.  You have abnormal vaginal discharge with abdominal pain.  You have persistent diarrhea.  You have abdominal pain that continues even after rest, or gets worse. MAKE SURE YOU:   Understand these instructions.  Will watch your condition.  Will get help right away if you are not doing well or get worse.   This information is not  intended to replace advice given to you by your health care provider. Make sure you discuss any questions you have with your health care provider.   Document Released: 01/18/2005 Document Revised: 11/08/2012 Document Reviewed: 08/17/2012 Elsevier Interactive Patient Education [employer]

## 2015-06-20 NOTE — MAU Note (Signed)
Patient presents with vaginal pain, no period last one 2 1/2 months ago, abdominal pain, vomiting, has had negative UPTs at home.

## 2015-06-21 LAB — RPR, QUANT+TP ABS (REFLEX): TREPONEMA PALLIDUM AB: NEGATIVE

## 2015-06-21 LAB — RPR: RPR Ser Ql: REACTIVE — AB

## 2015-06-21 LAB — HIV ANTIBODY (ROUTINE TESTING W REFLEX): HIV SCREEN 4TH GENERATION: NONREACTIVE

## 2015-06-22 ENCOUNTER — Telehealth: Payer: Self-pay | Admitting: Family

## 2015-06-22 ENCOUNTER — Encounter (HOSPITAL_COMMUNITY): Payer: Self-pay | Admitting: Family Medicine

## 2015-06-22 ENCOUNTER — Emergency Department (HOSPITAL_COMMUNITY)
Admission: EM | Admit: 2015-06-22 | Discharge: 2015-06-23 | Disposition: A | Discharge status: Home / Self Care | Payer: Self-pay | Attending: Emergency Medicine | Admitting: Emergency Medicine

## 2015-06-22 ENCOUNTER — Emergency Department (HOSPITAL_COMMUNITY): Payer: Self-pay

## 2015-06-22 ENCOUNTER — Encounter: Payer: Self-pay | Admitting: Advanced Practice Midwife

## 2015-06-22 DIAGNOSIS — O30011 Twin pregnancy, monochorionic/monoamniotic, first trimester: Secondary | ICD-10-CM | POA: Insufficient documentation

## 2015-06-22 DIAGNOSIS — O99341 Other mental disorders complicating pregnancy, first trimester: Secondary | ICD-10-CM | POA: Insufficient documentation

## 2015-06-22 DIAGNOSIS — R768 Other specified abnormal immunological findings in serum: Secondary | ICD-10-CM | POA: Insufficient documentation

## 2015-06-22 DIAGNOSIS — O9989 Other specified diseases and conditions complicating pregnancy, childbirth and the puerperium: Secondary | ICD-10-CM | POA: Insufficient documentation

## 2015-06-22 DIAGNOSIS — R109 Unspecified abdominal pain: Secondary | ICD-10-CM

## 2015-06-22 DIAGNOSIS — R1030 Lower abdominal pain, unspecified: Secondary | ICD-10-CM

## 2015-06-22 DIAGNOSIS — Z87891 Personal history of nicotine dependence: Secondary | ICD-10-CM | POA: Insufficient documentation

## 2015-06-22 DIAGNOSIS — R55 Syncope and collapse: Secondary | ICD-10-CM | POA: Insufficient documentation

## 2015-06-22 DIAGNOSIS — R1084 Generalized abdominal pain: Secondary | ICD-10-CM | POA: Insufficient documentation

## 2015-06-22 DIAGNOSIS — O219 Vomiting of pregnancy, unspecified: Secondary | ICD-10-CM | POA: Insufficient documentation

## 2015-06-22 DIAGNOSIS — F419 Anxiety disorder, unspecified: Secondary | ICD-10-CM | POA: Insufficient documentation

## 2015-06-22 DIAGNOSIS — Z3A01 Less than 8 weeks gestation of pregnancy: Secondary | ICD-10-CM | POA: Insufficient documentation

## 2015-06-22 DIAGNOSIS — R112 Nausea with vomiting, unspecified: Secondary | ICD-10-CM

## 2015-06-22 DIAGNOSIS — Z79899 Other long term (current) drug therapy: Secondary | ICD-10-CM | POA: Insufficient documentation

## 2015-06-22 LAB — COMPREHENSIVE METABOLIC PANEL
ALT: 23 U/L (ref 14–54)
AST: 33 U/L (ref 15–41)
Albumin: 3.9 g/dL (ref 3.5–5.0)
Alkaline Phosphatase: 80 U/L (ref 38–126)
Anion gap: 11 (ref 5–15)
BUN: 9 mg/dL (ref 6–20)
CHLORIDE: 102 mmol/L (ref 101–111)
CO2: 21 mmol/L — AB (ref 22–32)
Calcium: 9.5 mg/dL (ref 8.9–10.3)
Creatinine, Ser: 0.89 mg/dL (ref 0.44–1.00)
Glucose, Bld: 88 mg/dL (ref 65–99)
POTASSIUM: 3.2 mmol/L — AB (ref 3.5–5.1)
SODIUM: 134 mmol/L — AB (ref 135–145)
Total Bilirubin: 0.7 mg/dL (ref 0.3–1.2)
Total Protein: 7.5 g/dL (ref 6.5–8.1)

## 2015-06-22 LAB — URINALYSIS, ROUTINE W REFLEX MICROSCOPIC
Bilirubin Urine: NEGATIVE
GLUCOSE, UA: NEGATIVE mg/dL
HGB URINE DIPSTICK: NEGATIVE
Ketones, ur: 15 mg/dL — AB
Nitrite: NEGATIVE
PH: 5.5 (ref 5.0–8.0)
PROTEIN: NEGATIVE mg/dL
SPECIFIC GRAVITY, URINE: 1.023 (ref 1.005–1.030)

## 2015-06-22 LAB — CBC
HEMATOCRIT: 38.9 % (ref 36.0–46.0)
HEMOGLOBIN: 13.3 g/dL (ref 12.0–15.0)
MCH: 24.4 pg — AB (ref 26.0–34.0)
MCHC: 34.2 g/dL (ref 30.0–36.0)
MCV: 71.5 fL — AB (ref 78.0–100.0)
Platelets: 344 10*3/uL (ref 150–400)
RBC: 5.44 MIL/uL — AB (ref 3.87–5.11)
RDW: 14.4 % (ref 11.5–15.5)
WBC: 10 10*3/uL (ref 4.0–10.5)

## 2015-06-22 LAB — URINE MICROSCOPIC-ADD ON

## 2015-06-22 LAB — HCG, QUANTITATIVE, PREGNANCY: hCG, Beta Chain, Quant, S: 77310 m[IU]/mL — ABNORMAL HIGH (ref <5)

## 2015-06-22 LAB — LIPASE, BLOOD: LIPASE: 31 U/L (ref 11–51)

## 2015-06-22 MED ORDER — POTASSIUM CHLORIDE CRYS ER 20 MEQ PO TBCR
40.0000 meq | EXTENDED_RELEASE_TABLET | Freq: Once | ORAL | Status: AC
  Administered 2015-06-22: 40 meq via ORAL
  Filled 2015-06-22: qty 2

## 2015-06-22 MED ORDER — ONDANSETRON HCL 4 MG/2ML IJ SOLN
4.0000 mg | Freq: Once | INTRAMUSCULAR | Status: AC
  Administered 2015-06-22: 4 mg via INTRAVENOUS
  Filled 2015-06-22: qty 2

## 2015-06-22 MED ORDER — ACETAMINOPHEN 500 MG PO TABS
1000.0000 mg | ORAL_TABLET | Freq: Once | ORAL | Status: AC
  Administered 2015-06-22: 1000 mg via ORAL
  Filled 2015-06-22: qty 2

## 2015-06-22 MED ORDER — SODIUM CHLORIDE 0.9 % IV BOLUS (SEPSIS)
1000.0000 mL | Freq: Once | INTRAVENOUS | Status: AC
  Administered 2015-06-22: 1000 mL via INTRAVENOUS
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
## 2015-06-22 NOTE — ED Provider Notes (Signed)
CSN: [id number]     Arrival date & time 06/22/15  1614 History   First MD Initiated Contact with Patient 06/22/15 1931     Chief Complaint  Patient presents with  . Loss of Consciousness  . Anxiety  . Abdominal Pain     (Consider location/radiation/quality/duration/timing/severity/associated sxs/prior Treatment) HPI 20 year old female who presents with nausea, vomiting, and abdominal pain. She is G2 P1 at about [redacted] weeks gestational age. Physician has been having nausea and vomiting with generalized abdominal pain over the past 2 weeks. She was [facility] at [facility]. Etiology of her symptoms were felt to be benign. She was sent home with prescriptions for pyridoxine and doxylamine for nausea and vomiting. She has not filled this. She also has had pelvic exam performed there and diagnosed with vaginitis and given a course of Flagyl. Reports that she is not taking any of her nausea medications, and has had persistent nausea and vomiting with abdominal pain. States that she was vomiting today, and subsequently felt very lightheaded and had loss of consciousness. Denies any associating chest pain, shortness of breath, lower extremity edema or pain. Has not had any fever, vaginal bleeding or abnormal vaginal discharge. Localizes pain in her low abdomen with cramping.   Past Medical History  Diagnosis Date  . Medical history non-contributory    Past Surgical History  Procedure Laterality Date  . No past surgeries     Family History  Problem Relation Age of Onset  . Hypertension Father   . Hypertension Mother   . Diabetes Mother   . ADD / ADHD Brother   . Bipolar disorder Brother   . Schizophrenia Brother   . Anxiety disorder Sister    Social History  Substance Use Topics  . Smoking status: Former Smoker -- 0.50 packs/day    Quit date: 11/02/2014  . Smokeless tobacco: None  . Alcohol Use: Yes     Comment: Once a week.    OB History    Gravida Para Term Preterm AB  TAB SAB Ectopic Multiple Living   2 1 1       1      Review of Systems 10/14 systems reviewed and are negative other than those stated in the HPI   Allergies  Review of patient's allergies indicates no known allergies.  Home Medications   Prior to Admission medications   Medication Sig Start Date End Date Taking? Authorizing Provider  metroNIDAZOLE (FLAGYL) 500 MG tablet Take 1 tablet (500 mg total) by mouth 2 (two) times daily. 06/20/15  Yes Kathrynn RunningNoah Bedford Wouk, MD  doxylamine, Sleep, (UNISOM) 25 MG tablet Take 1 tablet (25 mg total) by mouth 4 (four) times daily as needed (nausea and vomiting). Patient not taking: Reported on 06/22/2015 06/20/15   Kathrynn RunningNoah Bedford Wouk, MD  Prenatal Multivit-Min-Fe-FA (PRENATAL VITAMINS) 0.8 MG tablet Take 1 tablet by mouth daily. Patient not taking: Reported on 06/22/2015 06/20/15   Kathrynn RunningNoah Bedford Wouk, MD  pyridOXINE (VITAMIN B-6) 25 MG tablet Take 1 tablet (25 mg total) by mouth 4 (four) times daily as needed (nausea and vomiting). Patient not taking: Reported on 06/22/2015 06/20/15   Kathrynn RunningNoah Bedford Wouk, MD   BP 116/98 mmHg  Pulse 56  Temp(Src) 98.2 F (36.8 C) (Oral)  Resp 14  Ht 5\' 7"  (1.702 m)  Wt 224 lb (101.606 kg)  BMI 35.08 kg/m2  SpO2 100%  LMP  Physical Exam Physical Exam  Nursing note and vitals reviewed. Constitutional: Well developed, well nourished, non-toxic, and  in no acute distress Head: Normocephalic and atraumatic.  Mouth/Throat: Oropharynx is clear and moist.  Neck: Normal range of motion. Neck supple.  Cardiovascular: Normal rate and regular rhythm.   Pulmonary/Chest: Effort normal and breath sounds normal.  Abdominal: Soft. No distension There is low abdominal tenderness. There is no rebound and no guarding. No cva tenderness Musculoskeletal: Normal range of motion.  Neurological: Alert, no facial droop, fluent speech, moves all extremities symmetrically Skin: Skin is warm and dry.  Psychiatric: Cooperative  ED Course   Procedures (including critical care time) Labs Review Labs Reviewed  COMPREHENSIVE METABOLIC PANEL - Abnormal; Notable for the following:    Sodium 134 (*)    Potassium 3.2 (*)    CO2 21 (*)    All other components within normal limits  CBC - Abnormal; Notable for the following:    RBC 5.44 (*)    MCV 71.5 (*)    MCH 24.4 (*)    All other components within normal limits  URINALYSIS, ROUTINE W REFLEX MICROSCOPIC (NOT AT [facility]) - Abnormal; Notable for the following:    Color, Urine AMBER (*)    APPearance CLOUDY (*)    Ketones, ur 15 (*)    Leukocytes, UA MODERATE (*)    All other components within normal limits  HCG, QUANTITATIVE, PREGNANCY - Abnormal; Notable for the following:    hCG, Beta Chain, Quant, S 77310 (*)    All other components within normal limits  URINE MICROSCOPIC-ADD ON - Abnormal; Notable for the following:    Squamous Epithelial / LPF 6-30 (*)    Bacteria, UA FEW (*)    All other components within normal limits  URINE CULTURE  LIPASE, BLOOD    Imaging Review US Ob Comp Less 14 Wks  06/22/2015  CLINICAL DATA:  Acute onset of generalized abdominal pain, nausea and vomiting. Syncope. Initial encounter. EXAM: TWIN OBSTETRIC <14WK [place] AND TRANSVAGINAL OB US COMPARISON:  Pelvic ultrasound performed 05/19/2012 FINDINGS: Number of IUPs:  2 Chorionicity/Amnionicity: Monochorionic-monoamniotic (no separating membrane seen) TWIN 1 Yolk sac:  Yes Embryo:  Yes Cardiac Activity: Yes Heart Rate: 119 bpm CRL:  5.6  mm   6 w 2 d                  [place] EDC: 02/13/2016 TWIN 2 Yolk sac:  Yes Embryo:  Yes Cardiac Activity: Yes Heart Rate: 117 bpm CRL:  5.8  mm   6 w 3 d                  [place] EDC: 02/12/2016 Subchorionic hemorrhage: A small amount of subchorionic hemorrhage is seen. Maternal uterus/adnexae: The uterus is otherwise unremarkable. The ovaries are unremarkable in appearance. The right ovary measures 2.9 x 1.8 x 2.3 cm, while the left ovary measures 3.1 x 2.0 x 2.5 cm. No  suspicious adnexal masses are seen; there is no evidence for ovarian torsion. A small amount of free fluid is seen within the pelvic cul-de-sac. IMPRESSION: 1. Live monochorionic monoamniotic twin pregnancy noted, with crown-rump lengths of 6 mm, corresponding to gestational ages of approximately 6 weeks 2 days. This does not match the gestational age by LMP, and reflects a new estimated date of delivery of February 13, 2016. 2. Small amount of subchorionic hemorrhage noted. Electronically Signed   By: Roanna Raider M.D.   On: 06/22/2015 23:43   US Ob Comp Addl Gest Less 14 Wks  06/22/2015  CLINICAL DATA:  Acute onset of generalized abdominal pain,  nausea and vomiting. Syncope. Initial encounter. EXAM: TWIN OBSTETRIC <14WK [place] AND TRANSVAGINAL OB US COMPARISON:  Pelvic ultrasound performed 05/19/2012 FINDINGS: Number of IUPs:  2 Chorionicity/Amnionicity: Monochorionic-monoamniotic (no separating membrane seen) TWIN 1 Yolk sac:  Yes Embryo:  Yes Cardiac Activity: Yes Heart Rate: 119 bpm CRL:  5.6  mm   6 w 2 d                  [place] EDC: 02/13/2016 TWIN 2 Yolk sac:  Yes Embryo:  Yes Cardiac Activity: Yes Heart Rate: 117 bpm CRL:  5.8  mm   6 w 3 d                  [place] EDC: 02/12/2016 Subchorionic hemorrhage: A small amount of subchorionic hemorrhage is seen. Maternal uterus/adnexae: The uterus is otherwise unremarkable. The ovaries are unremarkable in appearance. The right ovary measures 2.9 x 1.8 x 2.3 cm, while the left ovary measures 3.1 x 2.0 x 2.5 cm. No suspicious adnexal masses are seen; there is no evidence for ovarian torsion. A small amount of free fluid is seen within the pelvic cul-de-sac. IMPRESSION: 1. Live monochorionic monoamniotic twin pregnancy noted, with crown-rump lengths of 6 mm, corresponding to gestational ages of approximately 6 weeks 2 days. This does not match the gestational age by LMP, and reflects a new estimated date of delivery of February 13, 2016. 2. Small amount of subchorionic  hemorrhage noted. Electronically Signed   By: Roanna Raider M.D.   On: 06/22/2015 23:43   US Ob Transvaginal  06/22/2015  CLINICAL DATA:  Acute onset of generalized abdominal pain, nausea and vomiting. Syncope. Initial encounter. EXAM: TWIN OBSTETRIC <14WK [place] AND TRANSVAGINAL OB US COMPARISON:  Pelvic ultrasound performed 05/19/2012 FINDINGS: Number of IUPs:  2 Chorionicity/Amnionicity: Monochorionic-monoamniotic (no separating membrane seen) TWIN 1 Yolk sac:  Yes Embryo:  Yes Cardiac Activity: Yes Heart Rate: 119 bpm CRL:  5.6  mm   6 w 2 d                  [place] EDC: 02/13/2016 TWIN 2 Yolk sac:  Yes Embryo:  Yes Cardiac Activity: Yes Heart Rate: 117 bpm CRL:  5.8  mm   6 w 3 d                  [place] EDC: 02/12/2016 Subchorionic hemorrhage: A small amount of subchorionic hemorrhage is seen. Maternal uterus/adnexae: The uterus is otherwise unremarkable. The ovaries are unremarkable in appearance. The right ovary measures 2.9 x 1.8 x 2.3 cm, while the left ovary measures 3.1 x 2.0 x 2.5 cm. No suspicious adnexal masses are seen; there is no evidence for ovarian torsion. A small amount of free fluid is seen within the pelvic cul-de-sac. IMPRESSION: 1. Live monochorionic monoamniotic twin pregnancy noted, with crown-rump lengths of 6 mm, corresponding to gestational ages of approximately 6 weeks 2 days. This does not match the gestational age by LMP, and reflects a new estimated date of delivery of February 13, 2016. 2. Small amount of subchorionic hemorrhage noted. Electronically Signed   By: Roanna Raider M.D.   On: 06/22/2015 23:43   I have personally reviewed and evaluated these images and lab results as part of my medical decision-making.   EKG Interpretation   Date/Time:  Sunday Jun 22 2015 23:51:36 EDT Ventricular Rate:  67 PR Interval:  187 QRS Duration: 99 QT Interval:  408 QTC Calculation: 431 R Axis:   66 Text Interpretation:  Sinus rhythm Atrial premature complex Aside from  [facility], no other acute  changes since prior  Confirmed by LIU MD, DANA [phone])  on 06/22/2015 11:56:50 PM      MDM   Final diagnoses:  Monoamniotic and monochorionic twin gestation in first trimester  Non-intractable vomiting with nausea, vomiting of unspecified type  Lower abdominal pain    20 year old female G1 to P1 at about [redacted] weeks gestational age who presents with abdominal pain nausea and vomiting. On presentation she is nontoxic in no acute distress. Her vital signs are within normal limits. She has an overall soft and benign abdomen with low abdominal tenderness to palpation. No vaginal bleeding, and [facility] and diagnosed with BV for which she is taking antibiotics for. i do not feel that she requires repeat pelvic exam today. We'll need to rule out ectopic giving her abdominal pain, vomiting and syncopal episode today. I suspect that with the amount of vomiting that she has had that syncopal episode likely vasovagal vs orthostatic in nature by history.  No bleeding by history, no cp/sob/tachypnea/tachycardia/hypoxia to suggest PE. EKG with PACs, but otherwise unremarkable without signs of heart strain, stigmata of arrhythmia, or [employer]fr hypokalemia of 3.4, but no other major metabolic or electrolyte derangements. OB ultrasound performed she has evidence of twin gestation. There is small subchorionic hemorrhage, but no vaginal bleeding. Discuss implications of this. She is Rh+ on chart review. Tolerating by mouth after IV fluids, Tylenol, and a tight medics. She feels improved for discharge home. Strict return and follow-up instructions are reviewed. She expressed understanding of all discharge instructions, and felt comfortable to plan of care.   Lavera Guise, MD 06/23/15 [phone]

## 2015-06-22 NOTE — ED Notes (Signed)
Pt encouraged to provide urine specimen.  Pt stated "I need something to drink."  Informed pt will need to be seen by MD 1st.  Pt verbalized understanding.

## 2015-06-22 NOTE — Telephone Encounter (Signed)
Left message for patient to call MAU for RPR results - reactive; t pallidum abs negative.

## 2015-06-23 LAB — GC/CHLAMYDIA PROBE AMP (~~LOC~~) NOT AT ARMC
Chlamydia: NEGATIVE
NEISSERIA GONORRHEA: NEGATIVE

## 2015-06-23 NOTE — Discharge Instructions (Signed)
Please fill your nausea medications that were given to you at [facility] a few days ago. You have twins on your ultrasound today. You're given referral listed above for OB follow-up. Return without fail for worsening symptoms, including vaginal bleeding, worsening pain, vomiting unable to keep down food or fluids, or any other symptoms concerning to you.  Multiple Pregnancy Having a multiple pregnancy means you are carrying twins, triplets, or more. The majority of multiple pregnancies are twins. Naturally conceiving triplets or more (higher-order multiples) is quite rare. Multiple pregnancies can sometimes be riskier than single pregnancies. You are more likely to have certain problems. For example, you are at higher risk for high blood pressure during pregnancy (preeclampsia). You may need to have more frequent appointments for prenatal care.  CAUSES  Sometimes your body releases more than one egg at a time. Having more than one egg fertilized by more than one sperm is the most common type of multiple pregnancy. Twins produced this way are fraternal. They are no more alike than other siblings. Multiple pregnancies also result when one sperm fertilizes one egg and then that egg divides into more than one embryo. These are identical twins or triplets. Identical multiples are always the same gender and look very much alike. RISK FACTORS You may be at higher risk for having a multiple pregnancy if:  You are older than 35.  You have already had four or more children.  You have a family history of multiple pregnancy.  You have had fertility treatment. SIGNS AND SYMPTOMS Early signs and symptoms of multiple pregnancy include:  Rapid weight gain in the first 3 months of pregnancy (first trimester).  The uterus measuring larger than normal for your stage of pregnancy.  More severe nausea.  A higher-than-normal level of human chorionic gonadotropin (HCG). This is a hormone that your body  produces in early pregnancy. DIAGNOSIS  Your health care provider may suspect a multiple pregnancy from your symptoms and a physical exam. The results of your HCG blood test may also suggest a multiple pregnancy. Your health care provider will confirm that you are carrying multiples by doing an ultrasound exam. This exam involves using sound waves and a computer to get an image of the inside of your uterus. An ultrasound exam can confirm a multiple pregnancy after you have been pregnant for 6-8 weeks. TREATMENT  If you have a multiple pregnancy, you may need:  Early screening for possible birth defects or genetic abnormalities.  Frequent pelvic exams to check for signs of early labor.  Frequent ultrasound exams during the second trimester.  Frequent checks for high blood pressure and for protein in your urine. These are signs of preeclampsia.  An anti-inflammatory medicine (corticosteroid) if you go into early labor. This medicine helps your babies' lungs mature.  Magnesium sulfate to prevent cerebral palsy in your babies if you are expected to deliver before 32 weeks.  Cesarean delivery if vaginal delivery is too dangerous. HOME CARE INSTRUCTIONS  Because your pregnancy may be considered high risk, you have to work closely with your health care team. You may also need to make some lifestyle changes. These may include the following:  Increase your nutrition.  Gaining about 40-50 lb (18-23 kg) is recommended when you are pregnant with multiples. Try to gain 1 lb (0.5 kg) a week for the first 20 weeks of your pregnancy.  Your health care provider will let you know how many calories you should add to your diet each day.  Eat healthy snacks often throughout the day. This can add calories and reduce nausea.  Drink enough fluid to keep your urine clear or pale yellow.  Take your prenatal vitamins.  Take 1500-2000 mg of calcium daily starting at the 20th week of pregnancy until you deliver  your babies.  By 20-24 weeks, you may need to limit your activities.  Avoid strenuous activity and work.  Ask your health care provider when you should stop having sexual intercourse.  Rest often.  Do not smoke.  Do not drink alcohol.  Arrange for extra help around the house.  Keep all your prenatal appointments. SEEK MEDICAL CARE IF:  You have dizziness.  You have mild pelvic cramps, pelvic pressure, or nagging pain in the abdominal or low back area.  You have persistent nausea, vomiting, or diarrhea.  You have a bad smelling vaginal discharge.  You have pain with urination.  You are having trouble gaining weight.  You notice increased swelling in your face, hands, legs, or ankles.  You have a fever. SEEK IMMEDIATE MEDICAL CARE IF:   You are leaking fluid from your vagina.  You have spotting or bleeding from your vagina.  You have severe abdominal cramping or pain.  You have rapid weight gain or loss.  You vomit blood or material that looks like coffee grounds.  You are exposed to [place] measles and have never had them.  You are exposed to fifth disease or chickenpox.  You develop a severe headache.  You have shortness of breath.   This information is not intended to replace advice given to you by your health care provider. Make sure you discuss any questions you have with your health care provider.   Document Released: 10/28/2007 Document Revised: 02/08/2014 Document Reviewed: 12/22/2012 Elsevier Interactive Patient Education [employer]2016 Elsevier Inc.

## 2015-06-24 LAB — URINE CULTURE

## 2015-06-25 ENCOUNTER — Telehealth (HOSPITAL_BASED_OUTPATIENT_CLINIC_OR_DEPARTMENT_OTHER): Payer: Self-pay | Admitting: Emergency Medicine

## 2015-06-25 NOTE — Progress Notes (Signed)
ED Antimicrobial Stewardship Positive Culture Follow Up   Maureen Morris is an 20 y.o. female who [facility] with a chief complaint of  Chief Complaint  Patient presents with  . Loss of Consciousness  . Anxiety  . Abdominal Pain    Recent Results (from the past 720 hour(s))  Wet prep, genital     Status: Abnormal   Collection Time: 06/20/15  4:35 PM  Result Value Ref Range Status   Yeast Wet Prep HPF POC NONE SEEN NONE SEEN Final   Trich, Wet Prep PRESENT (A) NONE SEEN Final   Clue Cells Wet Prep HPF POC PRESENT (A) NONE SEEN Final   WBC, Wet Prep HPF POC MODERATE (A) NONE SEEN Final    Comment: MANY BACTERIA SEEN   Sperm NONE SEEN  Final  Urine culture     Status: Abnormal   Collection Time: 06/22/15  9:02 PM  Result Value Ref Range Status   Specimen Description URINE, RANDOM  Final   Special Requests NONE  Final   Culture (A)  Final    >=100,000 COLONIES/mL GROUP B STREP(S.AGALACTIAE)ISOLATED TESTING AGAINST S. AGALACTIAE NOT ROUTINELY PERFORMED DUE TO PREDICTABILITY OF AMP/PEN/VAN SUSCEPTIBILITY. [facility]formed [facility]    Report Status 06/24/2015 FINAL  Final    [x]  Patient discharged originally without antimicrobial agent and treatment is now indicated  New antibiotic prescription: Amoxicillin 500 mg PO BID x 7 days  ED Provider: Roxy Horsemanobert Browning, PA-C  Cassie L. Roseanne RenoStewart, PharmD PGY2 Infectious Diseases Pharmacy Resident Pager: [phone] 06/25/2015 8:59 AM

## 2015-06-25 NOTE — Telephone Encounter (Signed)
Post ED Visit - Positive Culture Follow-up: Successful Patient Follow-Up  Culture assessed and recommendations reviewed by: []  Enzo BiNathan Batchelder, Pharm.D. []  Celedonio MiyamotoJeremy Frens, Pharm.D., BCPS []  Garvin FilaMike Maccia, Pharm.D. []  Georgina PillionElizabeth Martin, Pharm.D., BCPS []  LowellMinh Pham, [address]Pharm[address]D., BCPS, AAHIVP []  Estella HuskMichelle Turner, Pharm.D., BCPS, AAHIVP [x]  Tennis Mustassie Stewart, [address]Pharm[address] []  Sherle Poeob Vincent, [address]  Positive urine culture  [x]  Patient discharged without antimicrobial prescription and treatment is now indicated []  Organism is resistant to prescribed ED discharge antimicrobial []  Patient with positive blood cultures  Changes discussed with ED provider: Roxy Horsemanobert Browning PA New antibiotic prescription  Amoxicillin 500mg  po bid x 7 days, f/u with OB/GYN for repeat culture  Attempting to contact patient     Maureen Morris, Maureen Morris 06/25/2015, 10:04 AM

## 2015-07-19 ENCOUNTER — Encounter (HOSPITAL_COMMUNITY): Payer: Self-pay | Admitting: Certified Nurse Midwife

## 2015-07-19 ENCOUNTER — Inpatient Hospital Stay (HOSPITAL_COMMUNITY)
Admission: AD | Admit: 2015-07-19 | Discharge: 2015-07-19 | Disposition: A | Discharge status: Home / Self Care | Payer: No Typology Code available for payment source | Source: Ambulatory Visit | Attending: Obstetrics and Gynecology | Admitting: Obstetrics and Gynecology

## 2015-07-19 DIAGNOSIS — O219 Vomiting of pregnancy, unspecified: Secondary | ICD-10-CM

## 2015-07-19 DIAGNOSIS — Z833 Family history of diabetes mellitus: Secondary | ICD-10-CM | POA: Insufficient documentation

## 2015-07-19 DIAGNOSIS — Z8249 Family history of ischemic heart disease and other diseases of the circulatory system: Secondary | ICD-10-CM | POA: Insufficient documentation

## 2015-07-19 DIAGNOSIS — Z3A10 10 weeks gestation of pregnancy: Secondary | ICD-10-CM | POA: Insufficient documentation

## 2015-07-19 DIAGNOSIS — Z818 Family history of other mental and behavioral disorders: Secondary | ICD-10-CM | POA: Insufficient documentation

## 2015-07-19 DIAGNOSIS — Z87891 Personal history of nicotine dependence: Secondary | ICD-10-CM | POA: Insufficient documentation

## 2015-07-19 LAB — URINALYSIS, ROUTINE W REFLEX MICROSCOPIC
Glucose, UA: NEGATIVE mg/dL
Hgb urine dipstick: NEGATIVE
Ketones, ur: 15 mg/dL — AB
NITRITE: NEGATIVE
PH: 5.5 (ref 5.0–8.0)
Protein, ur: NEGATIVE mg/dL
Specific Gravity, Urine: 1.025 (ref 1.005–1.030)

## 2015-07-19 LAB — URINE MICROSCOPIC-ADD ON

## 2015-07-19 MED ORDER — PROMETHAZINE HCL 12.5 MG PO TABS: 12.5000 mg | ORAL_TABLET | Freq: Four times a day (QID) | ORAL | Status: DC | PRN

## 2015-07-19 MED ORDER — ONDANSETRON 8 MG PO TBDP
8.0000 mg | ORAL_TABLET | Freq: Once | ORAL | Status: AC
  Administered 2015-07-19: 8 mg via ORAL
  Filled 2015-07-19: qty 1

## 2015-07-19 MED ORDER — METRONIDAZOLE 500 MG PO TABS: 500.0000 mg | ORAL_TABLET | Freq: Three times a day (TID) | ORAL | Status: DC

## 2015-07-19 MED ORDER — PROMETHAZINE HCL 25 MG/ML IJ SOLN
25.0000 mg | Freq: Once | INTRAMUSCULAR | Status: AC
  Administered 2015-07-19: 25 mg via INTRAMUSCULAR
  Filled 2015-07-19: qty 1

## 2015-07-19 NOTE — MAU Provider Note (Signed)
History   G2P1001 @ 10.2 wks in with nausea and vomiting for several days. States has not been able to keep food down x 2 days. No other complaints.  CSN: [id number]  Arrival date & time 07/19/15  1400   First Provider Initiated Contact with Patient 07/19/15 1408      Chief Complaint  Patient presents with  . Morning Sickness  . Emesis    HPI  Past Medical History  Diagnosis Date  . Medical history non-contributory     Past Surgical History  Procedure Laterality Date  . No past surgeries      Family History  Problem Relation Age of Onset  . Hypertension Father   . Hypertension Mother   . Diabetes Mother   . ADD / ADHD Brother   . Bipolar disorder Brother   . Schizophrenia Brother   . Anxiety disorder Sister     Social History  Substance Use Topics  . Smoking status: Former Smoker -- 0.50 packs/day    Quit date: 11/02/2014  . Smokeless tobacco: None  . Alcohol Use: No     Comment: Once a week.     OB History    Gravida Para Term Preterm AB TAB SAB Ectopic Multiple Living   2 1 1       1       Review of Systems  Constitutional: Negative.   HENT: Negative.   Eyes: Negative.   Respiratory: Negative.   Cardiovascular: Negative.   Gastrointestinal: Positive for nausea and vomiting.  Endocrine: Negative.   Genitourinary: Negative.   Musculoskeletal: Negative.   Skin: Negative.   Neurological: Negative.   Hematological: Negative.   Psychiatric/Behavioral: Negative.     Allergies  Review of patient's allergies indicates no known allergies.  Home Medications  No current outpatient prescriptions on file.  BP 129/69 mmHg  Pulse 49  Temp(Src) 98.1 F (36.7 C)  Resp 18  Ht 5\' 7"  (1.702 m)  Wt 213 lb 3.2 oz (96.707 kg)  BMI 33.38 kg/m2  LMP  (LMP Unknown)  Physical Exam  Constitutional: She is oriented to person, place, and time. She appears well-developed and well-nourished.  HENT:  Head: Normocephalic.  Eyes: Pupils are equal, round, and  reactive to light.  Neck: Normal range of motion.  Cardiovascular: Normal rate, regular rhythm, normal heart sounds and intact distal pulses.   Pulmonary/Chest: Effort normal and breath sounds normal.  Abdominal: Soft. Bowel sounds are normal.  Musculoskeletal: Normal range of motion.  Neurological: She is alert and oriented to person, place, and time. She has normal reflexes.  Skin: Skin is warm and dry.  Psychiatric: She has a normal mood and affect. Her behavior is normal. Judgment and thought content normal.    MAU Course  Procedures (including critical care time)  Labs Reviewed  URINALYSIS, ROUTINE W REFLEX MICROSCOPIC (NOT AT [facility])   No results found.   No diagnosis found.    MDM  Urine 15 ketones, pos for trich. Keeping fluids down and desires d/c home.

## 2015-07-19 NOTE — MAU Note (Signed)
For past 2 days unable to keep anything down.  Vomiting 9-10x in past 24 hrs

## 2015-07-19 NOTE — MAU Note (Signed)
Maureen Morris. Lawson CNM at the bedside for bedside ultrasound. Fetal cardiac activity present in both fetuses.

## 2015-07-19 NOTE — Discharge Instructions (Signed)
Morning Sickness Morning sickness is when you feel sick to your stomach (nauseous) during pregnancy. This nauseous feeling may or may not come with vomiting. It often occurs in the morning but can be a problem any time of day. Morning sickness is most common during the first trimester, but it may continue throughout pregnancy. While morning sickness is unpleasant, it is usually harmless unless you develop severe and continual vomiting (hyperemesis gravidarum). This condition requires more intense treatment.  CAUSES  The cause of morning sickness is not completely known but seems to be related to normal hormonal changes that occur in pregnancy. RISK FACTORS You are at greater risk if you:  Experienced nausea or vomiting before your pregnancy.  Had morning sickness during a previous pregnancy.  Are pregnant with more than one baby, such as twins. TREATMENT  Do not use any medicines (prescription, over-the-counter, or herbal) for morning sickness without first talking to your health care provider. Your health care provider may prescribe or recommend:  Vitamin B6 supplements.  Anti-nausea medicines.  The herbal medicine ginger. HOME CARE INSTRUCTIONS   Only take over-the-counter or prescription medicines as directed by your health care provider.  Taking multivitamins before getting pregnant can prevent or decrease the severity of morning sickness in most women.  Eat a piece of dry toast or unsalted crackers before getting out of bed in the morning.  Eat five or six small meals a day.  Eat dry and bland foods (rice, baked potato). Foods high in carbohydrates are often helpful.  Do not drink liquids with your meals. Drink liquids between meals.  Avoid greasy, fatty, and spicy foods.  Get someone to cook for you if the smell of any food causes nausea and vomiting.  If you feel nauseous after taking prenatal vitamins, take the vitamins at night or with a snack.  Snack on protein  foods (nuts, yogurt, cheese) between meals if you are hungry.  Eat unsweetened gelatins for desserts.  Wearing an acupressure wristband (worn for sea sickness) may be helpful.  Acupuncture may be helpful.  Do not smoke.  Get a humidifier to keep the air in your house free of odors.  Get plenty of fresh air. SEEK MEDICAL CARE IF:   Your home remedies are not working, and you need medicine.  You feel dizzy or lightheaded.  You are losing weight. SEEK IMMEDIATE MEDICAL CARE IF:   You have persistent and uncontrolled nausea and vomiting.  You pass out (faint). MAKE SURE YOU:  Understand these instructions.  Will watch your condition.  Will get help right away if you are not doing well or get worse.   This information is not intended to replace advice given to you by your health care provider. Make sure you discuss any questions you have with your health care provider.   Document Released: 03/11/2006 Document Revised: 01/23/2013 Document Reviewed: 07/05/2012 Elsevier Interactive Patient Education 2016 [employer] Trichomoniasis Trichomoniasis is an infection caused by an organism called Trichomonas. The infection can affect both women and men. In women, the outer female genitalia and the vagina are affected. In men, the penis is mainly affected, but the prostate and other reproductive organs can also be involved. Trichomoniasis is a sexually transmitted infection (STI) and is most often passed to another person through sexual contact.  RISK FACTORS  Having unprotected sexual intercourse.  Having sexual intercourse with an infected partner. SIGNS AND SYMPTOMS  Symptoms of trichomoniasis in women include:  Abnormal gray-green frothy vaginal discharge.  Itching  and irritation of the vagina.  Itching and irritation of the area outside the vagina. Symptoms of trichomoniasis in men include:   Penile discharge with or without pain.  Pain during urination. This results  from inflammation of the urethra. DIAGNOSIS  Trichomoniasis may be found during a Pap test or physical exam. Your health care provider may use one of the following methods to help diagnose this infection:  Testing the pH of the vagina with a test tape.  Using a vaginal swab test that checks for the Trichomonas organism. A test is available that provides results within a few minutes.  Examining a urine sample.  Testing vaginal secretions. Your health care provider may test you for other STIs, including HIV. TREATMENT   You may be given medicine to fight the infection. Women should inform their health care provider if they could be or are pregnant. Some medicines used to treat the infection should not be taken during pregnancy.  Your health care provider may recommend over-the-counter medicines or creams to decrease itching or irritation.  Your sexual partner will need to be treated if infected.  Your health care provider may test you for infection again 3 months after treatment. HOME CARE INSTRUCTIONS   Take medicines only as directed by your health care provider.  Take over-the-counter medicine for itching or irritation as directed by your health care provider.  Do not have sexual intercourse while you have the infection.  Women should not douche or wear tampons while they have the infection.  Discuss your infection with your partner. Your partner may have gotten the infection from you, or you may have gotten it from your partner.  Have your sex partner get examined and treated if necessary.  Practice safe, informed, and protected sex.  See your health care provider for other STI testing. SEEK MEDICAL CARE IF:   You still have symptoms after you finish your medicine.  You develop abdominal pain.  You have pain when you urinate.  You have bleeding after sexual intercourse.  You develop a rash.  Your medicine makes you sick or makes you throw up (vomit). MAKE SURE  YOU:  Understand these instructions.  Will watch your condition.  Will get help right away if you are not doing well or get worse.   This information is not intended to replace advice given to you by your health care provider. Make sure you discuss any questions you have with your health care provider.   Document Released: 07/14/2000 Document Revised: 02/08/2014 Document Reviewed: 10/30/2012 Elsevier Interactive Patient Education [employer]2016 Elsevier Inc.

## 2015-07-20 ENCOUNTER — Encounter: Payer: Self-pay | Admitting: Obstetrics and Gynecology

## 2015-07-20 DIAGNOSIS — A599 Trichomoniasis, unspecified: Secondary | ICD-10-CM | POA: Insufficient documentation

## 2015-08-19 ENCOUNTER — Telehealth: Payer: Self-pay | Admitting: *Deleted

## 2015-08-19 NOTE — Telephone Encounter (Signed)
(+)  urine culture, 06/23/2015, no response to phone or letter, unable to notify of (+) results

## 2016-04-24 ENCOUNTER — Encounter (HOSPITAL_COMMUNITY): Payer: Self-pay

## 2016-04-27 ENCOUNTER — Inpatient Hospital Stay (HOSPITAL_COMMUNITY)
Admission: AD | Admit: 2016-04-27 | Discharge: 2016-04-27 | Disposition: A | Discharge status: Home / Self Care | Payer: Self-pay | Source: Ambulatory Visit | Attending: Family Medicine | Admitting: Family Medicine

## 2016-04-27 ENCOUNTER — Encounter (HOSPITAL_COMMUNITY): Payer: Self-pay | Admitting: *Deleted

## 2016-04-27 ENCOUNTER — Inpatient Hospital Stay (HOSPITAL_COMMUNITY): Payer: Self-pay

## 2016-04-27 DIAGNOSIS — O98311 Other infections with a predominantly sexual mode of transmission complicating pregnancy, first trimester: Secondary | ICD-10-CM | POA: Insufficient documentation

## 2016-04-27 DIAGNOSIS — O26851 Spotting complicating pregnancy, first trimester: Secondary | ICD-10-CM | POA: Insufficient documentation

## 2016-04-27 DIAGNOSIS — A599 Trichomoniasis, unspecified: Secondary | ICD-10-CM

## 2016-04-27 DIAGNOSIS — Z3A10 10 weeks gestation of pregnancy: Secondary | ICD-10-CM | POA: Insufficient documentation

## 2016-04-27 DIAGNOSIS — R109 Unspecified abdominal pain: Secondary | ICD-10-CM

## 2016-04-27 DIAGNOSIS — O468X1 Other antepartum hemorrhage, first trimester: Secondary | ICD-10-CM

## 2016-04-27 DIAGNOSIS — Z87891 Personal history of nicotine dependence: Secondary | ICD-10-CM | POA: Insufficient documentation

## 2016-04-27 DIAGNOSIS — A5901 Trichomonal vulvovaginitis: Secondary | ICD-10-CM | POA: Insufficient documentation

## 2016-04-27 DIAGNOSIS — O209 Hemorrhage in early pregnancy, unspecified: Secondary | ICD-10-CM

## 2016-04-27 DIAGNOSIS — O418X10 Other specified disorders of amniotic fluid and membranes, first trimester, not applicable or unspecified: Secondary | ICD-10-CM

## 2016-04-27 DIAGNOSIS — O26891 Other specified pregnancy related conditions, first trimester: Secondary | ICD-10-CM

## 2016-04-27 LAB — URINALYSIS, ROUTINE W REFLEX MICROSCOPIC
Bilirubin Urine: NEGATIVE
Glucose, UA: NEGATIVE mg/dL
Ketones, ur: NEGATIVE mg/dL
Nitrite: NEGATIVE
Protein, ur: 30 mg/dL — AB
Specific Gravity, Urine: 1.021 (ref 1.005–1.030)
pH: 5 (ref 5.0–8.0)

## 2016-04-27 LAB — CBC WITH DIFFERENTIAL/PLATELET
BASOS ABS: 0 10*3/uL (ref 0.0–0.1)
BASOS PCT: 0 %
EOS ABS: 0.1 10*3/uL (ref 0.0–0.7)
Eosinophils Relative: 1 %
HCT: 34.1 % — ABNORMAL LOW (ref 36.0–46.0)
HEMOGLOBIN: 11.7 g/dL — AB (ref 12.0–15.0)
LYMPHS PCT: 25 %
Lymphs Abs: 2 10*3/uL (ref 0.7–4.0)
MCH: 25.1 pg — ABNORMAL LOW (ref 26.0–34.0)
MCHC: 34.3 g/dL (ref 30.0–36.0)
MCV: 73.2 fL — ABNORMAL LOW (ref 78.0–100.0)
MONO ABS: 0.3 10*3/uL (ref 0.1–1.0)
Monocytes Relative: 4 %
Neutro Abs: 5.5 10*3/uL (ref 1.7–7.7)
Neutrophils Relative %: 70 %
PLATELETS: 313 10*3/uL (ref 150–400)
RBC: 4.66 MIL/uL (ref 3.87–5.11)
RDW: 15.5 % (ref 11.5–15.5)
WBC: 7.9 10*3/uL (ref 4.0–10.5)

## 2016-04-27 LAB — HCG, QUANTITATIVE, PREGNANCY: HCG, BETA CHAIN, QUANT, S: 109614 m[IU]/mL — AB (ref <5)

## 2016-04-27 LAB — WET PREP, GENITAL
CLUE CELLS WET PREP: NONE SEEN
YEAST WET PREP: NONE SEEN

## 2016-04-27 LAB — POCT PREGNANCY, URINE: PREG TEST UR: POSITIVE — AB

## 2016-04-27 MED ORDER — METRONIDAZOLE 500 MG PO TABS
2000.0000 mg | ORAL_TABLET | Freq: Once | ORAL | Status: AC
  Administered 2016-04-27: 2000 mg via ORAL
  Filled 2016-04-27 (×2): qty 4

## 2016-04-27 NOTE — Discharge Instructions (Signed)
Trichomoniasis Trichomoniasis is an STI (sexually transmitted infection) that can affect both women and men. In women, the outer area of the female genitalia (vulva) and the vagina are affected. In men, the penis is mainly affected, but the prostate and other reproductive organs can also be involved. This condition can be treated with medicine. It often has no symptoms (is asymptomatic), especially in men. What are the causes? This condition is caused by an organism called Trichomonas vaginalis. Trichomoniasis most often spreads from person to person (is contagious) through sexual contact. What increases the risk? The following factors may make you more likely to develop this condition:  Having unprotected sexual intercourse.  Having sexual intercourse with a partner who has trichomoniasis.  Having multiple sexual partners.  Having had previous trichomoniasis infections or other STIs. What are the signs or symptoms? In women, symptoms of trichomoniasis include:  Abnormal vaginal discharge that is clear, white, gray, or yellow-green and foamy and has an unusual "fishy" odor.  Itching and irritation of the vagina and vulva.  Burning or pain during urination or sexual intercourse.  Genital redness and swelling. In men, symptoms of trichomoniasis include:  Penile discharge that may be foamy or contain pus.  Pain in the penis. This may happen only when urinating.  Itching or irritation inside the penis.  Burning after urination or ejaculation. How is this diagnosed? In women, this condition may be found during a routine Pap test or physical exam. It may be found in men during a routine physical exam. Your health care provider may perform tests to help diagnose this infection, such as:  Urine tests (men and women).  The following in women:  Testing the pH of the vagina.  A vaginal swab test that checks for the Trichomonas vaginalis organism.  Testing vaginal secretions. Your  health care provider may test you for other STIs, including HIV (human immunodeficiency virus). How is this treated? This condition is treated with medicine taken by mouth (orally), such as metronidazole or tinidazole to fight the infection. Your sexual partner(s) may also need to be tested and treated.  If you are a woman and you plan to become pregnant or think you may be pregnant, tell your health care provider right away. Some medicines that are used to treat the infection should not be taken during pregnancy. Your health care provider may recommend over-the-counter medicines or creams to help relieve itching or irritation. You may be tested for infection again 3 months after treatment. Follow these instructions at home:  Take and use over-the-counter and prescription medicines, including creams, only as told by your health care provider.  Do not have sexual intercourse until one week after you finish your medicine, or until your health care provider approves. Ask your health care provider when you may resume sexual intercourse.  (Women) Do not douche or wear tampons while you have the infection.  Discuss your infection with your sexual partner(s). Make sure that your partner gets tested and treated, if necessary.  Keep all follow-up visits as told by your health care provider. This is important. How is this prevented?  Use condoms every time you have sex. Using condoms correctly and consistently can help protect against STIs.  Avoid having multiple sexual partners.  Talk with your sexual partner about any symptoms that either of you may have, as well as any history of STIs.  Get tested for STIs and STDs (sexually transmitted diseases) before you have sex. Ask your partner to do the same.  Do not have sexual contact if you have symptoms of trichomoniasis or another STI. Contact a health care provider if:  You still have symptoms after you finish your medicine.  You develop pain in  your abdomen.  You have pain when you urinate.  You have bleeding after sexual intercourse.  You develop a rash.  You feel nauseous or you vomit.  You plan to become pregnant or think you may be pregnant. Summary  Trichomoniasis is an STI (sexually transmitted infection) that can affect both women and men.  This condition often has no symptoms (is asymptomatic), especially in men.  You should not have sexual intercourse until one week after you finish your medicine, or until your health care provider approves. Ask your health care provider when you may resume sexual intercourse.  Discuss your infection with your sexual partner. Make sure that your partner gets tested and treated, if necessary. This information is not intended to replace advice given to you by your health care provider. Make sure you discuss any questions you have with your health care provider. Document Released: 07/14/2000 Document Revised: 12/12/2015 Document Reviewed: 12/12/2015 Elsevier Interactive Patient Education  2017 Elsevier Inc. Subchorionic Hematoma A subchorionic hematoma is a gathering of blood between the outer wall of the placenta and the inner wall of the womb (uterus). The placenta is the organ that connects the fetus to the wall of the uterus. The placenta performs the feeding, breathing (oxygen to the fetus), and waste removal (excretory work) of the fetus. Subchorionic hematoma is the most common abnormality found on a result from ultrasonography done during the first trimester or early second trimester of pregnancy. If there has been little or no vaginal bleeding, early small hematomas usually shrink on their own and do not affect your baby or pregnancy. The blood is gradually absorbed over 1-2 weeks. When bleeding starts later in pregnancy or the hematoma is larger or occurs in an older pregnant woman, the outcome may not be as good. Larger hematomas may get bigger, which increases the chances for  miscarriage. Subchorionic hematoma also increases the risk of premature detachment of the placenta from the uterus, preterm (premature) labor, and stillbirth. Follow these instructions at home:  Stay on bed rest if your health care provider recommends this. Although bed rest will not prevent more bleeding or prevent a miscarriage, your health care provider may recommend bed rest until you are advised otherwise.  Avoid heavy lifting (more than 10 lb [4.5 kg]), exercise, sexual intercourse, or douching as directed by your health care provider.  Keep track of the number of pads you use each day and how soaked (saturated) they are. Write down this information.  Do not use tampons.  Keep all follow-up appointments as directed by your health care provider. Your health care provider may ask you to have follow-up blood tests or ultrasound tests or both. Get help right away if:  You have severe cramps in your stomach, back, abdomen, or pelvis.  You have a fever.  You pass large clots or tissue. Save any tissue for your health care provider to look at.  Your bleeding increases or you become lightheaded, feel weak, or have fainting episodes. This information is not intended to replace advice given to you by your health care provider. Make sure you discuss any questions you have with your health care provider. Document Released: 05/05/2006 Document Revised: 06/26/2015 Document Reviewed: 08/17/2012 Elsevier Interactive Patient Education  2017 [employer]

## 2016-04-27 NOTE — MAU Provider Note (Signed)
History     CSN: [id number]  Arrival date and time: 04/27/16 7829   First Provider Initiated Contact with Patient 04/27/16 0913      Chief Complaint  Patient presents with  . Headache  . Vaginal Bleeding   HPI [name] is a 21 y.o. G3P1001 at [redacted]w[redacted]d who presents to MAU today with complaint of spotting since yesterday. She also states moderate associated pelvic pain. She states only light bleeding that has become lighter since onset. She has not needed to wear a pad. She states LMP 02/12/16 and +HPT recently. She denies discharge. She has had N/V a few times most days. She denies diarrhea, constipation or fever.    OB History    Gravida Para Term Preterm AB Living   3 1 1     1    SAB TAB Ectopic Multiple Live Births           1      Past Medical History:  Diagnosis Date  . Medical history non-contributory     Past Surgical History:  Procedure Laterality Date  . NO PAST SURGERIES      Family History  Problem Relation Age of Onset  . Hypertension Father   . Hypertension Mother   . Diabetes Mother   . ADD / ADHD Brother   . Bipolar disorder Brother   . Schizophrenia Brother   . Anxiety disorder Sister     Social History  Substance Use Topics  . Smoking status: Former Smoker    Packs/day: 0.50    Quit date: 11/02/2014  . Smokeless tobacco: Never Used  . Alcohol use No     Comment: Once a week.     Allergies: No Known Allergies  No prescriptions prior to admission.    Review of Systems  Constitutional: Negative for fever.  Gastrointestinal: Positive for abdominal pain, nausea and vomiting. Negative for constipation and diarrhea.  Genitourinary: Positive for vaginal bleeding. Negative for dysuria, frequency, urgency and vaginal discharge.   Physical Exam   Blood pressure 124/69, pulse (!) 57, temperature 97.8 F (36.6 C), temperature source Oral, resp. rate 16, weight 199 lb 1.9 oz (90.3 kg), last menstrual period 02/12/2016, SpO2 100 %,  unknown if currently breastfeeding.  Physical Exam  Nursing note and vitals reviewed. Constitutional: She is oriented to person, place, and time. She appears well-developed and well-nourished. No distress.  HENT:  Head: Normocephalic and atraumatic.  Cardiovascular: Normal rate.   Respiratory: Effort normal.  GI: Soft. She exhibits no distension and no mass. There is no tenderness. There is no rebound and no guarding.  Genitourinary: Uterus is enlarged (slightly) and tender (mild). Cervix exhibits no motion tenderness, no discharge and no friability. Right adnexum displays no mass and no tenderness. Left adnexum displays no mass and no tenderness. There is bleeding (scant) in the vagina. Vaginal discharge (small thin, white discharge noted) found.  Neurological: She is alert and oriented to person, place, and time.  Skin: Skin is warm and dry. No erythema.  Psychiatric: She has a normal mood and affect.  Dilation: Closed Effacement (%): Thick Cervical Position: Posterior Exam by:: Magnus Sinning, PA-C   Results for orders placed or performed during the hospital encounter of 04/27/16 (from the past 24 hour(s))  Urinalysis, Routine w reflex microscopic     Status: Abnormal   Collection Time: 04/27/16  8:55 AM  Result Value Ref Range   Color, Urine YELLOW YELLOW   APPearance CLOUDY (A) CLEAR   Specific Gravity, Urine  1.021 1.005 - 1.030   pH 5.0 5.0 - 8.0   Glucose, UA NEGATIVE NEGATIVE mg/dL   Hgb urine dipstick LARGE (A) NEGATIVE   Bilirubin Urine NEGATIVE NEGATIVE   Ketones, ur NEGATIVE NEGATIVE mg/dL   Protein, ur 30 (A) NEGATIVE mg/dL   Nitrite NEGATIVE NEGATIVE   Leukocytes, UA LARGE (A) NEGATIVE   RBC / HPF 6-30 0 - 5 RBC/hpf   WBC, UA TOO NUMEROUS TO COUNT 0 - 5 WBC/hpf   Bacteria, UA FEW (A) NONE SEEN   Squamous Epithelial / LPF TOO NUMEROUS TO COUNT (A) NONE SEEN   Mucous PRESENT   Pregnancy, urine POC     Status: Abnormal   Collection Time: 04/27/16  9:10 AM  Result Value  Ref Range   Preg Test, Ur POSITIVE (A) NEGATIVE  Wet prep, genital     Status: Abnormal   Collection Time: 04/27/16  9:19 AM  Result Value Ref Range   Yeast Wet Prep HPF POC NONE SEEN NONE SEEN   Trich, Wet Prep PRESENT (A) NONE SEEN   Clue Cells Wet Prep HPF POC NONE SEEN NONE SEEN   WBC, Wet Prep HPF POC MANY (A) NONE SEEN   Sperm NOT DONE   CBC with Differential/Platelet     Status: Abnormal   Collection Time: 04/27/16  9:45 AM  Result Value Ref Range   WBC 7.9 4.0 - 10.5 K/uL   RBC 4.66 3.87 - 5.11 MIL/uL   Hemoglobin 11.7 (L) 12.0 - 15.0 g/dL   HCT 47.8 (L) 29.5 - 62.1 %   MCV 73.2 (L) 78.0 - 100.0 fL   MCH 25.1 (L) 26.0 - 34.0 pg   MCHC 34.3 30.0 - 36.0 g/dL   RDW 30.8 65.7 - 84.6 %   Platelets 313 150 - 400 K/uL   Neutrophils Relative % 70 %   Neutro Abs 5.5 1.7 - 7.7 K/uL   Lymphocytes Relative 25 %   Lymphs Abs 2.0 0.7 - 4.0 K/uL   Monocytes Relative 4 %   Monocytes Absolute 0.3 0.1 - 1.0 K/uL   Eosinophils Relative 1 %   Eosinophils Absolute 0.1 0.0 - 0.7 K/uL   Basophils Relative 0 %   Basophils Absolute 0.0 0.0 - 0.1 K/uL  hCG, quantitative, pregnancy     Status: Abnormal   Collection Time: 04/27/16  9:45 AM  Result Value Ref Range   hCG, Beta Chain, Quant, S 109,614 (H) <5 mIU/mL   US Ob Comp Less 14 Wks  Result Date: 04/27/2016 CLINICAL DATA:  Bleeding for 1 day EXAM: OBSTETRIC <14 WK ULTRASOUND TECHNIQUE: Transabdominal ultrasound was performed for evaluation of the gestation as well as the maternal uterus and adnexal regions. COMPARISON:  None. FINDINGS: Intrauterine gestational sac: Visualized Yolk sac:  Not visualized Embryo:  Visualized Cardiac Activity: Visualized Heart Rate: 178 bpm MSD:   mm    w     d CRL:   34  mm   10 w 2 d                  [place] EDC: Subchorionic hemorrhage:  Small subchorionic hemorrhage Maternal uterus/adnexae: No adnexal masses or free fluid. IMPRESSION: Ten week 2 day intrauterine pregnancy. Fetal heart rate 170 beats per minute.  Small subchorionic hemorrhage. Electronically Signed   By: Charlett Nose M.D.   On: 04/27/2016 11:07    MAU Course  Procedures None  MDM +UPT UA, wet prep, GC/chlamydia, CBC, quant hCG, HIV, RPR and [place] today to  rule out ectopic pregnancy 2 G Flagyl in MAU today for trichomonas  Assessment and Plan  A: SIUP at 870w5d Abdominal pain in pregnancy, first trimester Spotting in pregnancy Small subchorionic hemorrahge Trichomonas   P: Discharge home Treated in MAU for Trichomonas Partner treatment advised  First trimester/bleeding precautions discussed Patient advised to follow-up with OB provider of choice to start prenatal care Pregnancy confirmation letter given with list of OB providers Patient may return to MAU as needed or if her condition were to change or worsen  Marny LowensteinJulie N Sherica Paternostro, PA-C  04/27/2016, 11:30 AM

## 2016-04-27 NOTE — MAU Note (Signed)
Patient c/o headache that started yesterday. States initially it was like one of her migraines--currently she said it has eased off rating it an 8/10 after resting. Has not taken anything for the pain.   Patient c/o vaginal bleeding that started yesterday around 2pm. Initially it was light brown in color that has progressed to dark red this morning. Denies Clots nor having to wear a pad.

## 2016-04-28 LAB — RPR: RPR Ser Ql: NONREACTIVE

## 2016-04-28 LAB — HIV ANTIBODY (ROUTINE TESTING W REFLEX): HIV SCREEN 4TH GENERATION: NONREACTIVE

## 2016-04-29 ENCOUNTER — Telehealth (HOSPITAL_COMMUNITY): Payer: Self-pay

## 2016-04-29 LAB — GC/CHLAMYDIA PROBE AMP (~~LOC~~) NOT AT ARMC
Chlamydia: POSITIVE — AB
Neisseria Gonorrhea: POSITIVE — AB

## 2016-04-29 NOTE — Telephone Encounter (Signed)
1st attempt to contact patient regarding patient's STD lab results from her last visit. Left message on patient's voice mail to call back at her convenience. 

## 2016-04-30 ENCOUNTER — Telehealth (HOSPITAL_COMMUNITY): Payer: Self-pay

## 2016-04-30 NOTE — Telephone Encounter (Signed)
Called patient. Verified DOB with patient. Pt aware her lab results during her last visit to [facility] showed she was positive for Gonorrhea and Chlamydia. Pt aware to contact her the local Health Clinic in the county she resides to be seen for treatment of her positive results. Patient aware once treatment is received, abstain from intercourse x 2 weeks. Also, she is aware to inform her partner because they will also need to be treated. Communicable Disease Report faxed to health department.  ** Pt stated that "if the health dept was closed today with it being Good Friday that she would come to MAU because I can't wait on this."**

## 2017-01-26 ENCOUNTER — Emergency Department (HOSPITAL_COMMUNITY): Admission: EM | Admit: 2017-01-26 | Discharge: 2017-01-26 | Discharge status: Against Medical Advice | Payer: Self-pay

## 2017-01-26 NOTE — ED Notes (Signed)
Called patient for assigned room with no answer.  

## 2017-01-26 NOTE — ED Notes (Signed)
Pt called to be triaged with no response.  RN notified. 

## 2017-02-10 ENCOUNTER — Encounter (HOSPITAL_COMMUNITY): Payer: Self-pay

## 2019-03-07 ENCOUNTER — Ambulatory Visit (HOSPITAL_COMMUNITY)
Admission: EM | Admit: 2019-03-07 | Discharge: 2019-03-07 | Disposition: A | Discharge status: Home / Self Care | Payer: Self-pay | Attending: Family Medicine | Admitting: Family Medicine

## 2019-03-07 ENCOUNTER — Encounter (HOSPITAL_COMMUNITY): Payer: Self-pay | Admitting: Emergency Medicine

## 2019-03-07 ENCOUNTER — Other Ambulatory Visit: Payer: Self-pay

## 2019-03-07 ENCOUNTER — Ambulatory Visit (INDEPENDENT_AMBULATORY_CARE_PROVIDER_SITE_OTHER): Payer: Self-pay

## 2019-03-07 DIAGNOSIS — M25572 Pain in left ankle and joints of left foot: Secondary | ICD-10-CM

## 2019-03-07 DIAGNOSIS — S93492A Sprain of other ligament of left ankle, initial encounter: Secondary | ICD-10-CM

## 2019-03-07 DIAGNOSIS — M25472 Effusion, left ankle: Secondary | ICD-10-CM

## 2019-03-07 MED ORDER — DICLOFENAC SODIUM 1 % EX GEL: 2.0000 g | Freq: Four times a day (QID) | CUTANEOUS | 0 refills | Status: DC

## 2019-03-07 NOTE — ED Provider Notes (Signed)
[place]   [id number] 03/07/19 Arrival Time: 1517  ASSESSMENT & PLAN:  1. Acute left ankle pain   2. Sprain of anterior talofibular ligament of left ankle, initial encounter     I have personally viewed the imaging studies ordered this visit. No fractures appreciated.   Meds ordered this encounter  Medications  . diclofenac Sodium (VOLTAREN) 1 % GEL    Sig: Apply 2 g topically 4 (four) times daily.    Dispense:  150 g    Refill:  0   Has ibuprofen/Tylenol at home.  Natural history and expected course discussed. Questions answered. Rest, ice, compression, elevation (RICE) therapy. Scientist, clinical (histocompatibility and immunogenetics) distributed. Fit with ankle brace for use over next 1-2 weeks.  Declines crutches. Work note provided.  Recommend:  Follow-up Information    [place].   Why: If worsening or failing to improve as anticipated. Contact information: [address] [place] [phone]          Reviewed expectations re: course of current medical issues. Questions answered. Outlined signs and symptoms indicating need for more acute intervention. Patient verbalized understanding. After Visit Summary given.  SUBJECTIVE: History from: patient. Maureen Morris is a 24 y.o. female who reports fairly persistent mild to moderate pain of her left ankle; mostly laterally; described as aching; without radiation. Onset: abrupt. First noted: this morning. Injury/trama: unknown; reports going out and "celebrating" last evening with friends; does not remember injury to ankle or falling; has been able to bear weight with reported discomfort. Symptoms have progressed to a point and plateaued since beginning. Aggravating factors: weight bearing. Alleviating factors: rest. Associated symptoms: none reported. Extremity sensation changes or weakness: none. Self treatment: has not tried OTC therapies.  History of similar:  no.  Past Surgical History:  Procedure Laterality Date  . NO PAST SURGERIES        OBJECTIVE:  Vitals:   03/07/19 1306  BP: 112/72  Pulse: 70  Resp: 18  Temp: 98.1 F (36.7 C)  TempSrc: Oral  SpO2: 97%    General appearance: alert; no distress HEENT: [place]; AT Neck: supple with FROM Resp: unlabored respirations Extremities: . LLE: warm with well perfused appearance; poorly localized moderate tenderness over left lateral terior and posterior malleolus; without gross deformities; swelling: minimal; bruising: none; ankle ROM: normal, with discomfort CV: brisk extremity capillary refill of LLE; 2+ DP pulse of LLE. Skin: warm and dry; no visible rashes Neurologic: gait; normal but favors LLLE; normal sensation and strength of LLE Psychological: alert and cooperative; normal mood and affect  Imaging: DG Ankle Complete Left  Result Date: 03/07/2019 CLINICAL DATA:  Ankle injury, pain and swelling laterally EXAM: LEFT ANKLE COMPLETE - 3+ VIEW COMPARISON:  None. FINDINGS: There is no evidence of fracture, dislocation, or joint effusion. There is no evidence of arthropathy or other focal bone abnormality. Mild soft tissue edema over the lateral malleolus. IMPRESSION: No fracture or dislocation of the left ankle. Mild soft tissue edema over the lateral malleolus. Electronically Signed   By: Eddie Candle M.D.   On: 03/07/2019 13:27      No Known Allergies  Past Medical History:  Diagnosis Date  . Medical history non-contributory    Social History   Socioeconomic History  . Marital status: Single    Spouse name: Not on file  . Number of children: Not on file  . Years of education: Not on file  . Highest education level: Not on  file  Occupational History  . Not on file  Tobacco Use  . Smoking status: Current Every Day Smoker    Packs/day: 0.50    Last attempt to quit: 11/02/2014    Years since quitting: 4.3  . Smokeless tobacco: Never Used  Substance and Sexual Activity  .  Alcohol use: Yes    Comment: Once a week.   . Drug use: Yes    Types: Marijuana    Comment: previous  . Sexual activity: Yes    Birth control/protection: None  Other Topics Concern  . Not on file  Social History Narrative  . Not on file   Social Determinants of Health   Financial Resource Strain:   . Difficulty of Paying Living Expenses: Not on file  Food Insecurity:   . Worried About Programme researcher, broadcasting/film/video in the Last Year: Not on file  . Ran Out of Food in the Last Year: Not on file  Transportation Needs:   . Lack of Transportation (Medical): Not on file  . Lack of Transportation (Non-Medical): Not on file  Physical Activity:   . Days of Exercise per Week: Not on file  . Minutes of Exercise per Session: Not on file  Stress:   . Feeling of Stress : Not on file  Social Connections:   . Frequency of Communication with Friends and Family: Not on file  . Frequency of Social Gatherings with Friends and Family: Not on file  . Attends Religious Services: Not on file  . Active Member of Clubs or Organizations: Not on file  . Attends Banker Meetings: Not on file  . Marital Status: Not on file   Family History  Problem Relation Age of Onset  . Hypertension Father   . Hypertension Mother   . Diabetes Mother   . ADD / ADHD Brother   . Bipolar disorder Brother   . Schizophrenia Brother   . Anxiety disorder Sister    Past Surgical History:  Procedure Laterality Date  . NO PAST SURGERIES        Mardella Layman, MD 03/07/19 1346

## 2019-03-07 NOTE — ED Triage Notes (Signed)
Left ankle pain and swelling.  Patient admits to going out last night, celebrating and does not know what happened.  Able to move toes , good pedal pulses, but ankle is painful and swollen.  Left ankle is painful with bearing weight or not

## 2021-05-27 ENCOUNTER — Emergency Department (HOSPITAL_COMMUNITY)
Admission: EM | Admit: 2021-05-27 | Discharge: 2021-05-27 | Disposition: A | Discharge status: Home / Self Care | Payer: Self-pay | Attending: Emergency Medicine | Admitting: Emergency Medicine

## 2021-05-27 DIAGNOSIS — K0889 Other specified disorders of teeth and supporting structures: Secondary | ICD-10-CM | POA: Insufficient documentation

## 2021-05-27 MED ORDER — AMOXICILLIN 500 MG PO CAPS: 500.0000 mg | ORAL_CAPSULE | Freq: Three times a day (TID) | ORAL | 0 refills | Status: DC

## 2021-05-27 MED ORDER — DICLOFENAC SODIUM 75 MG PO TBEC: 75.0000 mg | DELAYED_RELEASE_TABLET | Freq: Two times a day (BID) | ORAL | 0 refills | Status: DC

## 2021-05-27 NOTE — ED Provider Notes (Signed)
?[facility] EMERGENCY DEPARTMENT ?Provider Note ? ? ?CSN: [id number] ?Arrival date & time: 05/27/21  0907 ? ?  ? ?History ? ?No chief complaint on file. ? ? ?Maureen Morris is a 26 y.o. female. ? ?The history is provided by the patient. No language interpreter was used.  ?Dental Pain ?Location:  Upper ?Upper teeth location:  14/LU 1st molar ?Quality:  Aching ?Severity:  Moderate ?Onset quality:  Gradual ?Duration:  1 week ?Timing:  Constant ?Progression:  Worsening ?Chronicity:  New ?Previous work-up:  Dental exam ?Relieved by:  Nothing ?Worsened by:  Nothing ? ?  ? ?Home Medications ?Prior to Admission medications   ?Medication Sig Start Date End Date Taking? Authorizing Provider  ?amoxicillin (AMOXIL) 500 MG capsule Take 1 capsule (500 mg total) by mouth 3 (three) times daily. 05/27/21  Yes Elson Areas, PA-C  ?diclofenac (VOLTAREN) 75 MG EC tablet Take 1 tablet (75 mg total) by mouth 2 (two) times daily. 05/27/21  Yes Cheron Schaumann K, PA-C  ?diclofenac Sodium (VOLTAREN) 1 % GEL Apply 2 g topically 4 (four) times daily. 03/07/19   Mardella Layman, MD  ?   ? ?Allergies    ?Patient has no known allergies.   ? ?Review of Systems   ?Review of Systems  ?All other systems reviewed and are negative. ? ?Physical Exam ?Updated Vital Signs ?BP 128/76 (BP Location: Right Arm)   Pulse (!) 57   Temp 98.8 ?F (37.1 ?C) (Oral)   Resp 20   SpO2 100%  ?Physical Exam ?Vitals and nursing note reviewed.  ?Constitutional:   ?   Appearance: She is well-developed.  ?HENT:  ?   Head: Normocephalic.  ?   Right Ear: Tympanic membrane normal.  ?   Left Ear: Tympanic membrane normal.  ?   Nose: Nose normal.  ?   Mouth/Throat:  ?   Comments: Swollen gumline  broken tooth ?Pulmonary:  ?   Effort: Pulmonary effort is normal.  ?Abdominal:  ?   General: There is no distension.  ?Musculoskeletal:     ?   General: Normal range of motion.  ?   Cervical back: Normal range of motion.  ?Neurological:  ?   Mental Status: She is alert  and oriented to person, place, and time.  ?Psychiatric:     ?   Mood and Affect: Mood normal.  ? ? ?ED Results / Procedures / Treatments   ?Labs ?(all labs ordered are listed, but only abnormal results are displayed) ?Labs Reviewed - No data to display ? ?EKG ?None ? ?Radiology ?No results found. ? ?Procedures ?Procedures  ? ? ?Medications Ordered in ED ?Medications - No data to display ? ?ED Course/ Medical Decision Making/ A&P ?  ?                        ?Medical Decision Making ?Risk ?Prescription drug management. ? ? ?MDM:  Pt may have early infection.  Pt given rx for amoxicillian and voltaren.  Pt advised to follow up with Dentist for evaltuion. ? ? ? ? ? ? ? ?Final Clinical Impression(s) / ED Diagnoses ?Final diagnoses:  ?None  ? ? ?Rx / DC Orders ?ED Discharge Orders   ? ?      Ordered  ?  amoxicillin (AMOXIL) 500 MG capsule  3 times daily       ? 05/27/21 0923  ?  diclofenac (VOLTAREN) 75 MG EC tablet  2 times daily       ?  05/27/21 1324  ? ?  ?  ? ?  ? ?An After Visit Summary was printed and given to the patient.  ?  ?Elson Areas, PA-C ?05/27/21 1033 ? ?  ?Virgina Norfolk, DO ?05/27/21 1330 ? ?

## 2021-05-27 NOTE — Discharge Instructions (Signed)
Return if any problems.

## 2021-11-30 IMAGING — DX DG ANKLE COMPLETE 3+V*L*
3 series · 3 of 3 positions shown · non-contrast
Comparison: None.

CLINICAL DATA: Ankle injury, pain and swelling laterally

EXAM:
LEFT ANKLE COMPLETE - 3+ VIEW

[ankle ap]
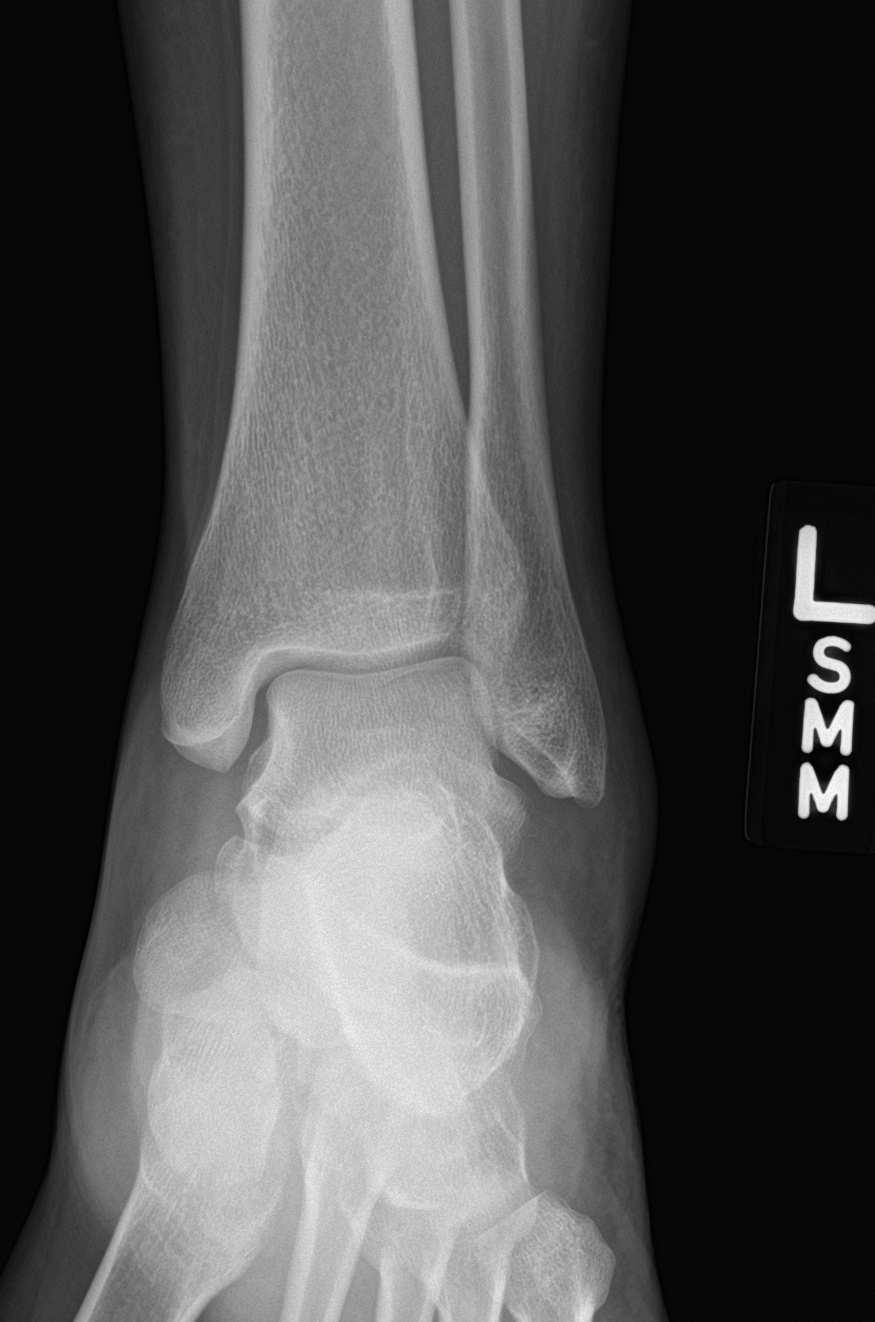

[ankle obl]
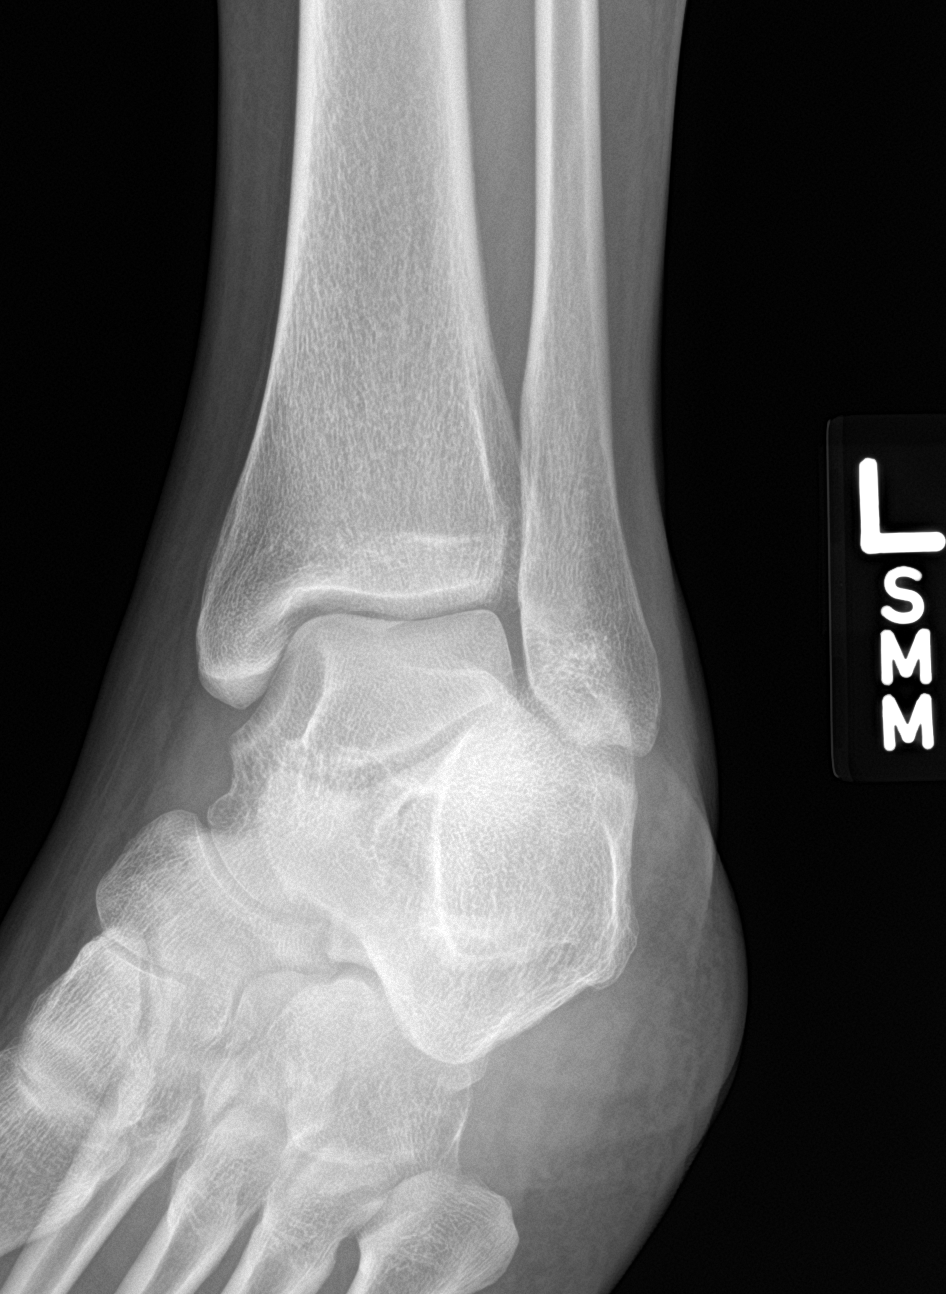

[ankle lat]
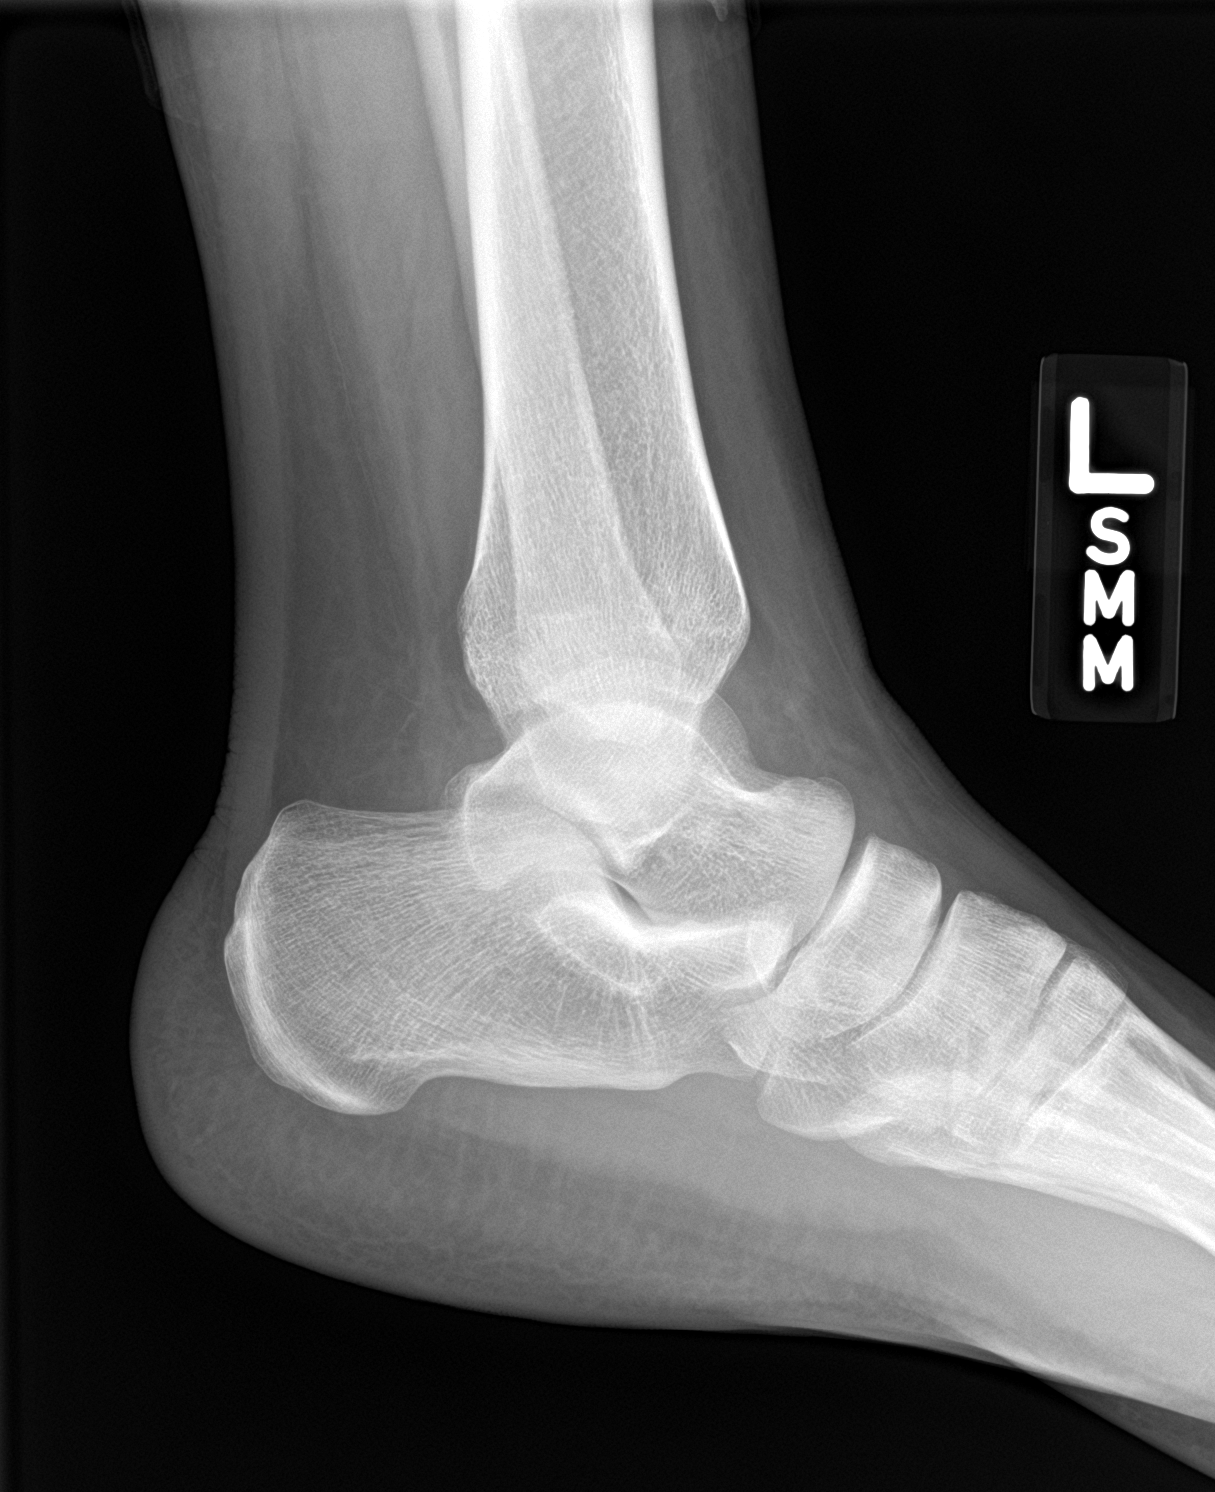

[3 of 3 positions shown; findings below may reference images not displayed]

FINDINGS: There is no evidence of fracture, dislocation, or joint effusion.
There is no evidence of arthropathy or other focal bone abnormality.
Mild soft tissue edema over the lateral malleolus.
IMPRESSION: No fracture or dislocation of the left ankle. Mild soft tissue edema
over the lateral malleolus.

## 2022-03-30 ENCOUNTER — Ambulatory Visit: Payer: Self-pay

## 2022-04-13 ENCOUNTER — Other Ambulatory Visit: Payer: Self-pay

## 2022-04-13 ENCOUNTER — Encounter (HOSPITAL_COMMUNITY): Payer: Self-pay

## 2022-04-13 ENCOUNTER — Emergency Department (HOSPITAL_COMMUNITY)
Admission: EM | Admit: 2022-04-13 | Discharge: 2022-04-13 | Disposition: A | Discharge status: Home / Self Care | Payer: Self-pay | Attending: Emergency Medicine | Admitting: Emergency Medicine

## 2022-04-13 DIAGNOSIS — Y92003 Bedroom of unspecified non-institutional (private) residence as the place of occurrence of the external cause: Secondary | ICD-10-CM | POA: Insufficient documentation

## 2022-04-13 DIAGNOSIS — W01190A Fall on same level from slipping, tripping and stumbling with subsequent striking against furniture, initial encounter: Secondary | ICD-10-CM | POA: Insufficient documentation

## 2022-04-13 DIAGNOSIS — S0181XA Laceration without foreign body of other part of head, initial encounter: Secondary | ICD-10-CM | POA: Insufficient documentation

## 2022-04-13 NOTE — ED Triage Notes (Signed)
C/o slip and fall in bedroom hitting forehead on headboard.  Denies loc Denies blood thinner usage Denies headache.

## 2022-04-13 NOTE — ED Provider Notes (Signed)
[facility] Provider Note   CSN: [id number] Arrival date & time: 04/13/22  0850     History  Chief Complaint  Patient presents with   Laceration    Maureen Morris is a 27 y.o. female.  27 year old female with prior medical history detailed below presents for evaluation.  Patient reports that she was getting ready for work this morning.  She slipped and fell on some water on the floor of her bedroom.  Her forehead struck the headboard of her bed.  She has a laceration to the mid upper forehead.  She denies LOC.  She denies neck pain.  She denies other injury.  Tetanus is up-to-date.  She was able to irrigate and clean out the wound at home.   The history is provided by the patient and medical records.       Home Medications Prior to Admission medications   Medication Sig Start Date End Date Taking? Authorizing Provider  amoxicillin (AMOXIL) 500 MG capsule Take 1 capsule (500 mg total) by mouth 3 (three) times daily. 05/27/21   Fransico Meadow, PA-C  diclofenac (VOLTAREN) 75 MG EC tablet Take 1 tablet (75 mg total) by mouth 2 (two) times daily. 05/27/21   Fransico Meadow, PA-C  diclofenac Sodium (VOLTAREN) 1 % GEL Apply 2 g topically 4 (four) times daily. 03/07/19   Vanessa Kick, MD      Allergies    Patient has no known allergies.    Review of Systems   Review of Systems  All other systems reviewed and are negative.   Physical Exam Updated Vital Signs BP (!) 146/95 (BP Location: Left Arm)   Pulse (!) 110   Temp 97.9 F (36.6 C) (Oral)   Resp 16   Wt 90 kg   SpO2 100%   BMI 31.08 kg/m  Physical Exam Vitals and nursing note reviewed.  Constitutional:      General: She is not in acute distress.    Appearance: Normal appearance. She is well-developed.  HENT:     Head: Normocephalic.  Eyes:     Conjunctiva/sclera: Conjunctivae normal.     Pupils: Pupils are equal, round, and reactive to light.  Cardiovascular:      Rate and Rhythm: Normal rate and regular rhythm.     Heart sounds: Normal heart sounds.  Pulmonary:     Effort: Pulmonary effort is normal. No respiratory distress.     Breath sounds: Normal breath sounds.  Abdominal:     General: There is no distension.     Palpations: Abdomen is soft.     Tenderness: There is no abdominal tenderness.  Musculoskeletal:        General: No deformity. Normal range of motion.     Cervical back: Normal range of motion and neck supple.  Skin:    General: Skin is warm and dry.     Comments: 1 cm laceration to the mid upper forehead.  Neurological:     General: No focal deficit present.     Mental Status: She is alert and oriented to person, place, and time.     ED Results / Procedures / Treatments   Labs (all labs ordered are listed, but only abnormal results are displayed) Labs Reviewed - No data to display  EKG None  Radiology No results found.  Procedures .Marland KitchenLaceration Repair  Date/Time: 04/13/2022 9:14 AM  Performed by: Valarie Merino, MD Authorized by: Valarie Merino, MD   Consent:  Consent obtained:  Verbal   Consent given by:  Patient   Risks, benefits, and alternatives were discussed: yes     Risks discussed:  Infection, need for additional repair, nerve damage, poor wound healing, poor cosmetic result, pain, retained foreign body, tendon damage and vascular damage   Alternatives discussed:  No treatment Universal protocol:    Immediately prior to procedure, a time out was called: yes     Patient identity confirmed:  Verbally with patient Anesthesia:    Anesthesia method:  None Laceration details:    Location:  Face   Face location:  Forehead   Length (cm):  1 Pre-procedure details:    Preparation:  Patient was prepped and draped in usual sterile fashion Exploration:    Limited defect created (wound extended): no     Hemostasis achieved with:  Direct pressure   Imaging outcome: foreign body not noted     Wound  exploration: wound explored through full range of motion     Contaminated: no   Treatment:    Area cleansed with:  Povidone-iodine   Amount of cleaning:  Standard   Irrigation solution:  Sterile saline   Debridement:  None   Undermining:  None Skin repair:    Repair method:  Tissue adhesive Approximation:    Approximation:  Close Repair type:    Repair type:  Simple Post-procedure details:    Dressing:  Open (no dressing)   Procedure completion:  Tolerated     Medications Ordered in ED Medications - No data to display  ED Course/ Medical Decision Making/ A&P                             Medical Decision Making   Medical Screen Complete  This patient presented to the ED with complaint of forehead laceration.  This complaint involves an extensive number of treatment options. The initial differential diagnosis includes, but is not limited to, laceration  This presentation is: Acute, Self-Limited, and Previously Undiagnosed  Patient with superficial forehead laceration after slip and fall.  She is without reported LOC, change in mental status, indication of significant head injury.  Laceration repair easily with Dermabond.  Tetanus is up-to-date.  Patient understands need for close outpatient follow-up.  Strict return precautions given and understood.  Additional history obtained:  External records from outside sources obtained and reviewed including prior ED visits and prior Inpatient records.  Problem List / ED Course:  Forehead laceration   Reevaluation:  After the interventions noted above, I reevaluated the patient and found that they have: improved  Disposition:  After consideration of the diagnostic results and the patients response to treatment, I feel that the patent would benefit from close outpatient follow-up.          Final Clinical Impression(s) / ED Diagnoses Final diagnoses:  Laceration of forehead, initial encounter    Rx / DC  Orders ED Discharge Orders     None         Valarie Merino, MD 04/13/22 [phone]

## 2022-04-13 NOTE — Discharge Instructions (Signed)
Return for any problem.  ?

## 2022-11-21 ENCOUNTER — Inpatient Hospital Stay (HOSPITAL_COMMUNITY)
Admission: AD | Admit: 2022-11-21 | Discharge: 2022-11-21 | Disposition: A | Discharge status: Home / Self Care | Payer: Self-pay | Attending: Obstetrics and Gynecology | Admitting: Obstetrics and Gynecology

## 2022-11-21 ENCOUNTER — Inpatient Hospital Stay (HOSPITAL_COMMUNITY): Payer: Self-pay

## 2022-11-21 ENCOUNTER — Ambulatory Visit
Admission: EM | Admit: 2022-11-21 | Discharge: 2022-11-21 | Disposition: A | Discharge status: Home / Self Care | Payer: Self-pay | Attending: Internal Medicine | Admitting: Internal Medicine

## 2022-11-21 ENCOUNTER — Encounter: Payer: Self-pay | Admitting: Emergency Medicine

## 2022-11-21 ENCOUNTER — Encounter (HOSPITAL_COMMUNITY): Payer: Self-pay | Admitting: Obstetrics and Gynecology

## 2022-11-21 DIAGNOSIS — R109 Unspecified abdominal pain: Secondary | ICD-10-CM

## 2022-11-21 DIAGNOSIS — O26899 Other specified pregnancy related conditions, unspecified trimester: Secondary | ICD-10-CM

## 2022-11-21 DIAGNOSIS — Z3201 Encounter for pregnancy test, result positive: Secondary | ICD-10-CM

## 2022-11-21 DIAGNOSIS — Z3A01 Less than 8 weeks gestation of pregnancy: Secondary | ICD-10-CM | POA: Insufficient documentation

## 2022-11-21 DIAGNOSIS — O26891 Other specified pregnancy related conditions, first trimester: Secondary | ICD-10-CM | POA: Insufficient documentation

## 2022-11-21 DIAGNOSIS — R1031 Right lower quadrant pain: Secondary | ICD-10-CM | POA: Insufficient documentation

## 2022-11-21 DIAGNOSIS — R102 Pelvic and perineal pain: Secondary | ICD-10-CM

## 2022-11-21 HISTORY — DX: Urinary tract infection, site not specified: N39.0

## 2022-11-21 HISTORY — DX: Dermatitis, unspecified: L30.9

## 2022-11-21 HISTORY — DX: Anxiety disorder, unspecified: F41.9

## 2022-11-21 HISTORY — DX: Depression, unspecified: F32.A

## 2022-11-21 LAB — POCT URINALYSIS DIP (MANUAL ENTRY)
Bilirubin, UA: NEGATIVE
Blood, UA: NEGATIVE
Glucose, UA: NEGATIVE mg/dL
Ketones, POC UA: NEGATIVE mg/dL
Nitrite, UA: NEGATIVE
Protein Ur, POC: NEGATIVE mg/dL
Spec Grav, UA: >=1.03 — AB (ref 1.010–1.025)
Urobilinogen, UA: 0.2 U/dL
pH, UA: 6 (ref 5.0–8.0)

## 2022-11-21 LAB — CBC
HCT: 37.1 % (ref 36.0–46.0)
Hemoglobin: 12.2 g/dL (ref 12.0–15.0)
MCH: 24.3 pg — ABNORMAL LOW (ref 26.0–34.0)
MCHC: 32.9 g/dL (ref 30.0–36.0)
MCV: 73.9 fL — ABNORMAL LOW (ref 80.0–100.0)
Platelets: 309 10*3/uL (ref 150–400)
RBC: 5.02 MIL/uL (ref 3.87–5.11)
RDW: 14.6 % (ref 11.5–15.5)
WBC: 6.3 10*3/uL (ref 4.0–10.5)
nRBC: 0 % (ref 0.0–0.2)

## 2022-11-21 LAB — URINALYSIS, ROUTINE W REFLEX MICROSCOPIC
Bilirubin Urine: NEGATIVE
Glucose, UA: NEGATIVE mg/dL
Hgb urine dipstick: NEGATIVE
Ketones, ur: NEGATIVE mg/dL
Nitrite: NEGATIVE
Protein, ur: NEGATIVE mg/dL
Specific Gravity, Urine: 1.02 (ref 1.005–1.030)
pH: 5 (ref 5.0–8.0)

## 2022-11-21 LAB — HIV ANTIBODY (ROUTINE TESTING W REFLEX): HIV Screen 4th Generation wRfx: NONREACTIVE

## 2022-11-21 LAB — RPR: RPR Ser Ql: NONREACTIVE

## 2022-11-21 LAB — WET PREP, GENITAL
Sperm: NONE SEEN
Trich, Wet Prep: NONE SEEN
WBC, Wet Prep HPF POC: <10 (ref <10)
Yeast Wet Prep HPF POC: NONE SEEN

## 2022-11-21 LAB — POCT URINE PREGNANCY: Preg Test, Ur: POSITIVE — AB

## 2022-11-21 LAB — HCG, QUANTITATIVE, PREGNANCY: hCG, Beta Chain, Quant, S: 44982 m[IU]/mL — ABNORMAL HIGH (ref <5)

## 2022-11-21 NOTE — MAU Note (Signed)
Maureen Morris is a 27 y.o. at [redacted]w[redacted]d here in MAU reporting: having abd pain in the pelvic area, hurts really bad.  Right side. Sharp pains. No bleeding. LMP: 8/29 Onset of complaint: this morning Pain score: 9 Vitals:   11/21/22 1122  BP: 131/75  Pulse: (!) 56  Resp: 20  Temp: 98.2 F (36.8 C)  SpO2: 99%      Lab orders placed from triage:  +preg test at [facility], sent over for further eval

## 2022-11-21 NOTE — Discharge Instructions (Signed)
Please go to the ER for further workup of your symptoms  

## 2022-11-21 NOTE — Discharge Instructions (Signed)

## 2022-11-21 NOTE — ED Notes (Signed)
Patient is being discharged from the Urgent Care and sent to the Emergency Department via POV with friend. Per Cheri Rous, NP, patient is in need of higher level of care due to pelvic pain, abdominal tenderness during pregnancy. Patient is aware and verbalizes understanding of plan of care.  Vitals:   11/21/22 1002  BP: 117/76  Pulse: 77  Resp: 17  Temp: 99.2 F (37.3 C)  SpO2: 97%

## 2022-11-21 NOTE — MAU Provider Note (Signed)
History     CSN: [id number]  Arrival date and time: 11/21/22 1040   Event Date/Time   First Provider Initiated Contact with Patient 11/21/22 1225      Chief Complaint  Patient presents with   Abdominal Pain   HPI [name] is a 27 y.o. year old [id number] female at [redacted]w[redacted]d weeks gestation who presents to MAU reporting RT lower abdominal pain. She states, "it hurts really bad." She describes the pain as sharp on the RT side; rated 9/10. The onset of the pain was this morning. She denies VB. She went to Urgent Care on [facility]; where she had a (+) UPT. She was then sent to MAU for further evaluation. She plans to receive [facility]; her first appt is 12/01/2022. Her daughter is present and contributing to the history taking.    OB History     Gravida  6   Para  1   Term  1   Preterm      AB  4   Living  1      SAB      IAB  4   Ectopic      Multiple      Live Births  1        Obstetric Comments  4 elective AB- procedures, no complications         Past Medical History:  Diagnosis Date   Anxiety    Depression    Eczema    Medical history non-contributory    UTI (urinary tract infection)     Past Surgical History:  Procedure Laterality Date   THERAPEUTIC ABORTION     4 elective    Family History  Problem Relation Age of Onset   Hypertension Father    Hypertension Mother    Diabetes Mother    ADD / ADHD Brother    Bipolar disorder Brother    Schizophrenia Brother    Anxiety disorder Sister     Social History   Tobacco Use   Smoking status: Former    Current packs/day: 0.00    Types: Cigarettes    Quit date: 11/02/2014    Years since quitting: 8.0   Smokeless tobacco: Never   Tobacco comments:    Stopped with +preg  test  Vaping Use   Vaping status: Never Used  Substance Use Topics   Alcohol use: Not Currently    Comment: Once a week.    Drug use: Not Currently    Types: Marijuana     Comment: stopped with preg, early Oct    Allergies: No Known Allergies  Medications Prior to Admission  Medication Sig Dispense Refill Last Dose   amoxicillin (AMOXIL) 500 MG capsule Take 1 capsule (500 mg total) by mouth 3 (three) times daily. 30 capsule 0    diclofenac (VOLTAREN) 75 MG EC tablet Take 1 tablet (75 mg total) by mouth 2 (two) times daily. 20 tablet 0    diclofenac Sodium (VOLTAREN) 1 % GEL Apply 2 g topically 4 (four) times daily. 150 g 0     Review of Systems  Constitutional: Negative.   HENT: Negative.    Eyes: Negative.   Respiratory: Negative.    Cardiovascular: Negative.   Gastrointestinal: Negative.   Endocrine: Negative.   Genitourinary:  Positive for pelvic pain (lower RT).  Musculoskeletal: Negative.   Skin: Negative.   Allergic/Immunologic: Negative.   Neurological: Negative.   Hematological: Negative.   Psychiatric/Behavioral: Negative.  Physical Exam   Blood pressure 113/76, pulse (!) 50, temperature 98.2 F (36.8 C), temperature source Oral, resp. rate 20, height 5\' 7"  (1.702 m), weight 99.5 kg, last menstrual period 09/30/2022, SpO2 99%, unknown if currently breastfeeding.  Physical Exam Vitals and nursing note reviewed.  Constitutional:      Appearance: Normal appearance. She is obese.  Cardiovascular:     Rate and Rhythm: Bradycardia present.  Pulmonary:     Effort: Pulmonary effort is normal.  Abdominal:     General: Abdomen is flat.     Palpations: Abdomen is soft.     Tenderness: There is abdominal tenderness (RT with palpation).  Genitourinary:    General: Normal vulva.  Musculoskeletal:        General: Normal range of motion.  Skin:    General: Skin is warm and dry.  Neurological:     Mental Status: She is alert and oriented to person, place, and time.  Psychiatric:        Mood and Affect: Mood normal.        Behavior: Behavior normal.        Thought Content: Thought content normal.        Judgment: Judgment normal.      MAU Course  Procedures  MDM CCUA UCx -- Results pending  UPT CBC ABO/Rh HCG OB U/S < 14 wks TVUS  Results for orders placed or performed during the hospital encounter of 11/21/22 (from the past 24 hour(s))  Urinalysis, Routine w reflex microscopic -Urine, Clean Catch     Status: Abnormal   Collection Time: 11/21/22 11:40 AM  Result Value Ref Range   Color, Urine YELLOW YELLOW   APPearance CLOUDY (A) CLEAR   Specific Gravity, Urine 1.020 1.005 - 1.030   pH 5.0 5.0 - 8.0   Glucose, UA NEGATIVE NEGATIVE mg/dL   Hgb urine dipstick NEGATIVE NEGATIVE   Bilirubin Urine NEGATIVE NEGATIVE   Ketones, ur NEGATIVE NEGATIVE mg/dL   Protein, ur NEGATIVE NEGATIVE mg/dL   Nitrite NEGATIVE NEGATIVE   Leukocytes,Ua MODERATE (A) NEGATIVE   RBC / HPF 6-10 0 - 5 RBC/hpf   WBC, UA 11-20 0 - 5 WBC/hpf   Bacteria, UA FEW (A) NONE SEEN   Squamous Epithelial / HPF 11-20 0 - 5 /HPF   Mucus PRESENT   Wet prep, genital     Status: Abnormal   Collection Time: 11/21/22 11:41 AM   Specimen: Vaginal  Result Value Ref Range   Yeast Wet Prep HPF POC NONE SEEN NONE SEEN   Trich, Wet Prep NONE SEEN NONE SEEN   Clue Cells Wet Prep HPF POC PRESENT (A) NONE SEEN   WBC, Wet Prep HPF POC <10 <10   Sperm NONE SEEN   CBC     Status: Abnormal   Collection Time: 11/21/22 12:12 PM  Result Value Ref Range   WBC 6.3 4.0 - 10.5 K/uL   RBC 5.02 3.87 - 5.11 MIL/uL   Hemoglobin 12.2 12.0 - 15.0 g/dL   HCT 16.1 09.6 - 04.5 %   MCV 73.9 (L) 80.0 - 100.0 fL   MCH 24.3 (L) 26.0 - 34.0 pg   MCHC 32.9 30.0 - 36.0 g/dL   RDW 40.9 81.1 - 91.4 %   Platelets 309 150 - 400 K/uL   nRBC 0.0 0.0 - 0.2 %  hCG, quantitative, pregnancy     Status: Abnormal   Collection Time: 11/21/22 12:12 PM  Result Value Ref Range   hCG, Beta Chain, Quant,  S 44,982 (H) <5 mIU/mL  HIV Antibody (routine testing w rflx)     Status: None   Collection Time: 11/21/22 12:12 PM  Result Value Ref Range   HIV Screen 4th Generation wRfx Non  Reactive Non Reactive  RPR     Status: None   Collection Time: 11/21/22 12:12 PM  Result Value Ref Range   RPR Ser Ql NON REACTIVE NON REACTIVE    US OB LESS THAN 14 WEEKS WITH OB TRANSVAGINAL  Result Date: 11/21/2022 CLINICAL DATA:  Abdominal pain during 1st trimester pregnancy. EXAM: OBSTETRIC <14 WK [place] AND TRANSVAGINAL OB US TECHNIQUE: Both transabdominal and transvaginal ultrasound examinations were performed for complete evaluation of the gestation as well as the maternal uterus, adnexal regions, and pelvic cul-de-sac. Transvaginal technique was performed to assess early pregnancy. COMPARISON:  None Available. FINDINGS: Intrauterine gestational sac: Single Yolk sac:  Visualized. Embryo:  Visualized. Cardiac Activity: Visualized. Heart Rate: 107 bpm CRL:  4 mm   6 w   1 d                  [place] EDC: 07/16/2023 Subchorionic hemorrhage:  None visualized. Maternal uterus/adnexae: Both ovaries are normal in appearance. No mass or abnormal free fluid identified. IMPRESSION: Single living IUP with estimated gestational age of [redacted] weeks 1 day, and [place] EDC of 07/16/2023. No maternal uterine or adnexal abnormality identified. Electronically Signed   By: Danae Orleans M.D.   On: 11/21/2022 14:43     Assessment and Plan  1. Abdominal pain during pregnancy, first trimester - Information provided on abdominal pain in pregnancy - UCx pending 2. [redacted] weeks gestation of pregnancy - Safe Meds in Pregnancy List Provided  - Discharge patient - Keep scheduled appt with [facility] on 12/01/2022 - Patient verbalized an understanding of the plan of care and agrees.    Raelyn Mora, CNM 11/21/2022, 3:09 PM

## 2022-11-21 NOTE — ED Triage Notes (Signed)
Pt had vomiting since Thursday. Had BM on Friday. Pt c/o pelvic pain that started this morning that woke her up out of her sleep. Pt had a home pregnancy that was positive.

## 2022-11-21 NOTE — ED Provider Notes (Signed)
[facility]    CSN: [id number] Arrival date & time: 11/21/22  3664      History   Chief Complaint Chief Complaint  Patient presents with   Emesis   Pelvic Pain   Possible Pregnancy    HPI Maureen Morris is a 27 y.o. female presents for pelvic pain.  Patient reports she has been having nausea vomiting for the past 3 days.  States she has had multiple home positive pregnancy test since the symptoms began.  States her last menstrual period was early September.  States this morning she was awoken out of her sleep with the right sided pelvic pain that comes and goes but is increasing in length and frequency.  States it is a sharp pain.  Denies any vaginal bleeding or discharge.  No dysuria.  This is not her first pregnancy and denies any complications during her previous pregnancy.  No fevers or chills.  Last bowel movement was Friday and was normal for her.  No other concerns at this time.   Emesis Pelvic Pain  Possible Pregnancy    Past Medical History:  Diagnosis Date   Medical history non-contributory     Patient Active Problem List   Diagnosis Date Noted   Trichomoniasis 07/20/2015   Biological false positive RPR test 06/22/2015   SVD (spontaneous vaginal delivery) 08/15/2012   Sinus bradycardia 08/15/2012   Pregnancy, supervision for, high-risk 04/24/2012   Late prenatal care complicating pregnancy 04/18/2012    Past Surgical History:  Procedure Laterality Date   NO PAST SURGERIES      OB History     Gravida  4   Para  1   Term  1   Preterm      AB      Living  1      SAB      IAB      Ectopic      Multiple      Live Births  1            Home Medications    Prior to Admission medications   Medication Sig Start Date End Date Taking? Authorizing Provider  amoxicillin (AMOXIL) 500 MG capsule Take 1 capsule (500 mg total) by mouth 3 (three) times daily. 05/27/21   Elson Areas, PA-C  diclofenac (VOLTAREN) 75 MG EC  tablet Take 1 tablet (75 mg total) by mouth 2 (two) times daily. 05/27/21   Elson Areas, PA-C  diclofenac Sodium (VOLTAREN) 1 % GEL Apply 2 g topically 4 (four) times daily. 03/07/19   Mardella Layman, MD    Family History Family History  Problem Relation Age of Onset   Hypertension Father    Hypertension Mother    Diabetes Mother    ADD / ADHD Brother    Bipolar disorder Brother    Schizophrenia Brother    Anxiety disorder Sister     Social History Social History   Tobacco Use   Smoking status: Every Day    Current packs/day: 0.00    Types: Cigarettes    Last attempt to quit: 11/02/2014    Years since quitting: 8.0   Smokeless tobacco: Never  Substance Use Topics   Alcohol use: Yes    Comment: Once a week.    Drug use: Yes    Types: Marijuana    Comment: previous     Allergies   Patient has no known allergies.   Review of Systems Review of Systems  Gastrointestinal:  Positive  for vomiting.  Genitourinary:  Positive for pelvic pain.     Physical Exam Triage Vital Signs ED Triage Vitals  Encounter Vitals Group     BP 11/21/22 1002 117/76     Systolic BP Percentile --      Diastolic BP Percentile --      Pulse Rate 11/21/22 1002 77     Resp 11/21/22 1002 17     Temp 11/21/22 1002 99.2 F (37.3 C)     Temp Source 11/21/22 1002 Oral     SpO2 11/21/22 1002 97 %     Weight --      Height --      Head Circumference --      Peak Flow --      Pain Score 11/21/22 1001 6     Pain Loc --      Pain Education --      Exclude from Growth Chart --    No data found.  Updated Vital Signs BP 117/76 (BP Location: Right Arm)   Pulse 77   Temp 99.2 F (37.3 C) (Oral)   Resp 17   LMP 09/30/2022   SpO2 97%   Visual Acuity Right Eye Distance:   Left Eye Distance:   Bilateral Distance:    Right Eye Near:   Left Eye Near:    Bilateral Near:     Physical Exam Vitals and nursing note reviewed.  Constitutional:      General: She is not in acute distress.     Appearance: Normal appearance. She is not ill-appearing.  HENT:     Head: Normocephalic and atraumatic.  Eyes:     Pupils: Pupils are equal, round, and reactive to light.  Cardiovascular:     Rate and Rhythm: Normal rate.  Pulmonary:     Effort: Pulmonary effort is normal.  Abdominal:     General: Bowel sounds are normal.     Tenderness: There is abdominal tenderness in the right upper quadrant, right lower quadrant, periumbilical area, suprapubic area, left upper quadrant and left lower quadrant.       Comments: Moderate tenderness to palpation to the right lower quadrant/pelvic area.  Skin:    General: Skin is warm and dry.  Neurological:     General: No focal deficit present.     Mental Status: She is alert and oriented to person, place, and time.  Psychiatric:        Mood and Affect: Mood normal.        Behavior: Behavior normal.      UC Treatments / Results  Labs (all labs ordered are listed, but only abnormal results are displayed) Labs Reviewed  POCT URINE PREGNANCY - Abnormal; Notable for the following components:      Result Value   Preg Test, Ur Positive (*)    All other components within normal limits  POCT URINALYSIS DIP (MANUAL ENTRY) - Abnormal; Notable for the following components:   Clarity, UA hazy (*)    Spec Grav, UA >=1.030 (*)    Leukocytes, UA Trace (*)    All other components within normal limits    EKG   Radiology No results found.  Procedures Procedures (including critical care time)  Medications Ordered in UC Medications - No data to display  Initial Impression / Assessment and Plan / UC Course  I have reviewed the triage vital signs and the nursing notes.  Pertinent labs & imaging results that were available during my care of the patient  were reviewed by me and considered in my medical decision making (see chart for details).     Reviewed exam and symptoms with patient.  Urine pregnancy positive.  UA with trace leuks dysuria.   Early with right sided pelvic pain that began today that is worsening.  Given known pregnancy with new onset pelvic pain concern for possible ectopic pregnancy.  Advise she go to the emergency room for further workup of her symptoms.  She is in agreement plan will go POV to the emergency room Final Clinical Impressions(s) / UC Diagnoses   Final diagnoses:  Positive urine pregnancy test  Pelvic pain in pregnancy     Discharge Instructions      Please go to the ER for further workup of your symptoms    ED Prescriptions   None    PDMP not reviewed this encounter.   Radford Pax, NP 11/21/22 1020

## 2022-11-22 LAB — GC/CHLAMYDIA PROBE AMP (~~LOC~~) NOT AT ARMC
Chlamydia: NEGATIVE
Comment: NEGATIVE
Comment: NORMAL
Neisseria Gonorrhea: NEGATIVE

## 2022-11-22 LAB — CULTURE, OB URINE: Culture: <10000 — AB

## 2023-02-02 NOTE — L&D Delivery Note (Signed)
 Delivery Note Maureen Morris is a 28 y.o. [id number] at [redacted]w[redacted]d admitted for SROM.   GBS Status:  Negative, Negative/-- (10/06 0000)  Labor course: Initial SVE: 3/60/-2. Augmentation with: Pitocin . She then progressed to complete.  ROM: 16h 70m with clear fluid  Birth: After a 30 minute 2nd stage, she delivered a Live born female  Birth Weight:   APGAR: 9, 9  Newborn Delivery   Birth date/time: 11/18/2025 03:36:22 Delivery type: Vaginal, Spontaneous        Delivered via spontaneous vaginal delivery (Presentation: OA ). Nuchal cord present: Yes. reduced. Shoulders and body delivered in usual fashion. Infant placed directly on mom's abdomen for bonding/skin-to-skin, baby dried and stimulated. Cord clamped x 2 after 1 minute and cut by FOB.  Cord blood collected. Placenta delivered-Spontaneous with 3 vessels. 20u Pitocin  in 500cc LR given as a bolus prior to delivery of placenta.  Fundus firm with massage. Placenta inspected and appears to be intact with a 3 VC.  Sponge and instrument count were correct x2.  Intrapartum complications:  None Anesthesia:  epidural Lacerations:  none Suture Repair:  EBL (mL):132.00    Mom to postpartum.  Baby to Couplet care / Skin to Skin. Placenta to L&D   Plans to Breastfeed Contraception: Mirena  IUD placed (see separate note) Circumcision: wants inpatient  Note sent to [facility]: N/A, [facility] pt for pp visit.  Delivery Report:  Review the Delivery Report for details.     Signed: Cathlean Ely, DNP,CNM 11/18/2023, 4:08 AM

## 2023-04-15 ENCOUNTER — Inpatient Hospital Stay (HOSPITAL_COMMUNITY)
Admission: AD | Admit: 2023-04-15 | Discharge: 2023-04-15 | Disposition: A | Discharge status: Home / Self Care | Attending: Obstetrics and Gynecology | Admitting: Obstetrics and Gynecology

## 2023-04-15 ENCOUNTER — Encounter (HOSPITAL_COMMUNITY): Payer: Self-pay | Admitting: *Deleted

## 2023-04-15 DIAGNOSIS — Z3A00 Weeks of gestation of pregnancy not specified: Secondary | ICD-10-CM | POA: Insufficient documentation

## 2023-04-15 DIAGNOSIS — Z3201 Encounter for pregnancy test, result positive: Secondary | ICD-10-CM | POA: Insufficient documentation

## 2023-04-15 DIAGNOSIS — O219 Vomiting of pregnancy, unspecified: Secondary | ICD-10-CM | POA: Diagnosis present

## 2023-04-15 DIAGNOSIS — Z5321 Procedure and treatment not carried out due to patient leaving prior to being seen by health care provider: Secondary | ICD-10-CM | POA: Insufficient documentation

## 2023-04-15 LAB — URINALYSIS, ROUTINE W REFLEX MICROSCOPIC
Bilirubin Urine: NEGATIVE
Glucose, UA: NEGATIVE mg/dL
Hgb urine dipstick: NEGATIVE
Ketones, ur: 5 mg/dL — AB
Nitrite: NEGATIVE
Protein, ur: NEGATIVE mg/dL
Specific Gravity, Urine: 1.016 (ref 1.005–1.030)
pH: 5 (ref 5.0–8.0)

## 2023-04-15 LAB — POCT PREGNANCY, URINE: Preg Test, Ur: POSITIVE — AB

## 2023-04-15 NOTE — MAU Note (Signed)
No in lobby.

## 2023-04-15 NOTE — MAU Note (Signed)
Not in lobby

## 2023-04-15 NOTE — Progress Notes (Signed)
Not in lobby x1  

## 2023-04-15 NOTE — MAU Note (Signed)
.  Maureen Morris is a 28 y.o. at Unknown here in MAU reporting: has had n/v x3 days. Can't keep anything down. Had positie HPt .  C/o lower abd pain and cramping with the vomiting  LMP: 02/19/2023 Onset of complaint: 3 days Pain score: 8 Vitals:   04/15/23 1649  BP: 123/72  Pulse: (!) 53  Resp: 18  Temp: 98.4 F (36.9 C)     FHT: n/a  Lab orders placed from triage: UPT,U/A

## 2023-04-18 ENCOUNTER — Inpatient Hospital Stay (HOSPITAL_COMMUNITY)
Admission: AD | Admit: 2023-04-18 | Discharge: 2023-04-19 | Disposition: A | Discharge status: Home / Self Care | Payer: Self-pay | Attending: Obstetrics & Gynecology | Admitting: Obstetrics & Gynecology

## 2023-04-18 DIAGNOSIS — A599 Trichomoniasis, unspecified: Secondary | ICD-10-CM | POA: Diagnosis present

## 2023-04-18 DIAGNOSIS — R102 Pelvic and perineal pain: Secondary | ICD-10-CM | POA: Insufficient documentation

## 2023-04-18 DIAGNOSIS — O3680X0 Pregnancy with inconclusive fetal viability, not applicable or unspecified: Secondary | ICD-10-CM

## 2023-04-18 DIAGNOSIS — O98311 Other infections with a predominantly sexual mode of transmission complicating pregnancy, first trimester: Secondary | ICD-10-CM | POA: Insufficient documentation

## 2023-04-18 DIAGNOSIS — Z3A08 8 weeks gestation of pregnancy: Secondary | ICD-10-CM

## 2023-04-18 DIAGNOSIS — O26891 Other specified pregnancy related conditions, first trimester: Secondary | ICD-10-CM | POA: Insufficient documentation

## 2023-04-18 DIAGNOSIS — A5901 Trichomonal vulvovaginitis: Secondary | ICD-10-CM | POA: Insufficient documentation

## 2023-04-18 DIAGNOSIS — Z3A01 Less than 8 weeks gestation of pregnancy: Secondary | ICD-10-CM | POA: Insufficient documentation

## 2023-04-19 ENCOUNTER — Inpatient Hospital Stay (HOSPITAL_COMMUNITY)

## 2023-04-19 DIAGNOSIS — A599 Trichomoniasis, unspecified: Secondary | ICD-10-CM

## 2023-04-19 DIAGNOSIS — R1030 Lower abdominal pain, unspecified: Secondary | ICD-10-CM | POA: Diagnosis present

## 2023-04-19 DIAGNOSIS — Z3A08 8 weeks gestation of pregnancy: Secondary | ICD-10-CM

## 2023-04-19 DIAGNOSIS — O26891 Other specified pregnancy related conditions, first trimester: Secondary | ICD-10-CM | POA: Diagnosis not present

## 2023-04-19 DIAGNOSIS — R102 Pelvic and perineal pain: Secondary | ICD-10-CM | POA: Diagnosis not present

## 2023-04-19 DIAGNOSIS — A5901 Trichomonal vulvovaginitis: Secondary | ICD-10-CM | POA: Diagnosis not present

## 2023-04-19 DIAGNOSIS — O98311 Other infections with a predominantly sexual mode of transmission complicating pregnancy, first trimester: Secondary | ICD-10-CM | POA: Diagnosis not present

## 2023-04-19 DIAGNOSIS — O209 Hemorrhage in early pregnancy, unspecified: Secondary | ICD-10-CM | POA: Diagnosis present

## 2023-04-19 DIAGNOSIS — O3680X0 Pregnancy with inconclusive fetal viability, not applicable or unspecified: Secondary | ICD-10-CM

## 2023-04-19 DIAGNOSIS — Z3A01 Less than 8 weeks gestation of pregnancy: Secondary | ICD-10-CM | POA: Diagnosis not present

## 2023-04-19 LAB — CBC
HCT: 39.3 % (ref 36.0–46.0)
Hemoglobin: 13.4 g/dL (ref 12.0–15.0)
MCH: 24.9 pg — ABNORMAL LOW (ref 26.0–34.0)
MCHC: 34.1 g/dL (ref 30.0–36.0)
MCV: 73 fL — ABNORMAL LOW (ref 80.0–100.0)
Platelets: 328 10*3/uL (ref 150–400)
RBC: 5.38 MIL/uL — ABNORMAL HIGH (ref 3.87–5.11)
RDW: 14.6 % (ref 11.5–15.5)
WBC: 7.8 10*3/uL (ref 4.0–10.5)
nRBC: 0 % (ref 0.0–0.2)

## 2023-04-19 LAB — URINALYSIS, ROUTINE W REFLEX MICROSCOPIC
Bilirubin Urine: NEGATIVE
Glucose, UA: NEGATIVE mg/dL
Hgb urine dipstick: NEGATIVE
Ketones, ur: NEGATIVE mg/dL
Nitrite: NEGATIVE
Protein, ur: NEGATIVE mg/dL
Specific Gravity, Urine: 1.016 (ref 1.005–1.030)
pH: 5 (ref 5.0–8.0)

## 2023-04-19 LAB — WET PREP, GENITAL
Sperm: NONE SEEN
WBC, Wet Prep HPF POC: <10
Yeast Wet Prep HPF POC: NONE SEEN

## 2023-04-19 LAB — GC/CHLAMYDIA PROBE AMP (~~LOC~~) NOT AT ARMC
Chlamydia: NEGATIVE
Comment: NEGATIVE
Comment: NORMAL
Neisseria Gonorrhea: NEGATIVE

## 2023-04-19 LAB — HIV ANTIBODY (ROUTINE TESTING W REFLEX): HIV Screen 4th Generation wRfx: NONREACTIVE

## 2023-04-19 LAB — HCG, QUANTITATIVE, PREGNANCY: hCG, Beta Chain, Quant, S: 89200 m[IU]/mL — ABNORMAL HIGH

## 2023-04-19 MED ORDER — METRONIDAZOLE 500 MG PO TABS: 500.0000 mg | ORAL_TABLET | Freq: Two times a day (BID) | ORAL | 0 refills | Status: AC

## 2023-04-19 NOTE — MAU Provider Note (Signed)
 Chief Complaint: Abdominal Pain   Event Date/Time   First Provider Initiated Contact with Patient 04/19/23 0041        SUBJECTIVE HPI: Maureen Morris is a 28 y.o. [id number] at [redacted]w[redacted]d by LMP who presents to maternity admissions reporting sharp lower abdominal pain.  States her miscarriage felt the same. . She denies vaginal bleeding, urinary symptoms, n/v, or fever/chills.    Abdominal Pain The pain is located in the RLQ, LLQ and suprapubic region. The quality of the pain is sharp. The abdominal pain does not radiate. Pertinent negatives include no constipation, diarrhea or dysuria. The pain is relieved by Nothing. She has tried nothing for the symptoms.   RN Note:  Pt says feels sharp lower pain- started at 8pm-  Last time this happened - she had SAB No VB Pain- 10/10- no meds for pain Last sex- not in 72 hrs.      Past Medical History:  Diagnosis Date   Anxiety    Depression    Eczema    Medical history non-contributory    UTI (urinary tract infection)    Past Surgical History:  Procedure Laterality Date   THERAPEUTIC ABORTION     4 elective   Social History   Socioeconomic History   Marital status: Single    Spouse name: Not on file   Number of children: Not on file   Years of education: Not on file   Highest education level: Not on file  Occupational History   Not on file  Tobacco Use   Smoking status: Former    Current packs/day: 0.00    Types: Cigarettes    Quit date: 11/02/2014    Years since quitting: 8.4   Smokeless tobacco: Never   Tobacco comments:    Stopped with +preg  test  Vaping Use   Vaping status: Never Used  Substance and Sexual Activity   Alcohol use: Not Currently    Comment: Once a week.    Drug use: Not Currently    Types: Marijuana    Comment: stopped with preg, early Oct   Sexual activity: Yes    Birth control/protection: None  Other Topics Concern   Not on file  Social History Narrative   Not on file   Social Drivers of  Health   Financial Resource Strain: Not on file  Food Insecurity: Not on file  Transportation Needs: Not on file  Physical Activity: Not on file  Stress: Not on file  Social Connections: Not on file  Intimate Partner Violence: Not on file   No current facility-administered medications on file prior to encounter.   No current outpatient medications on file prior to encounter.   No Known Allergies  I have reviewed patient's Past Medical Hx, Surgical Hx, Family Hx, Social Hx, medications and allergies.   ROS:  Review of Systems  Gastrointestinal:  Positive for abdominal pain. Negative for constipation and diarrhea.  Genitourinary:  Negative for dysuria.   Review of Systems  Other systems negative   Physical Exam  Physical Exam Patient Vitals for the past 24 hrs:  BP Temp Temp src Pulse Resp Height Weight  04/19/23 0015 120/84 98.4 F (36.9 C) Oral (!) 54 16 5\' 7"  (1.702 m) 100.2 kg   Constitutional: Well-developed, well-nourished female in no acute distress.  Cardiovascular: normal rate Respiratory: normal effort GI: Abd soft, non-tender. Pos BS x 4 MS: Extremities nontender, no edema, normal ROM Neurologic: Alert and oriented x 4.  GU: Neg CVAT.  PELVIC  EXAM: deferred in lieu of ultrasound  LAB RESULTS Results for orders placed or performed during the hospital encounter of 04/18/23 (from the past 24 hours)  Wet prep, genital     Status: Abnormal   Collection Time: 04/19/23 12:20 AM  Result Value Ref Range   Yeast Wet Prep HPF POC NONE SEEN NONE SEEN   Trich, Wet Prep PRESENT (A) NONE SEEN   Clue Cells Wet Prep HPF POC PRESENT (A) NONE SEEN   WBC, Wet Prep HPF POC <10 <10   Sperm NONE SEEN   Urinalysis, Routine w reflex microscopic -Urine, Clean Catch     Status: Abnormal   Collection Time: 04/19/23 12:24 AM  Result Value Ref Range   Color, Urine YELLOW YELLOW   APPearance HAZY (A) CLEAR   Specific Gravity, Urine 1.016 1.005 - 1.030   pH 5.0 5.0 - 8.0    Glucose, UA NEGATIVE NEGATIVE mg/dL   Hgb urine dipstick NEGATIVE NEGATIVE   Bilirubin Urine NEGATIVE NEGATIVE   Ketones, ur NEGATIVE NEGATIVE mg/dL   Protein, ur NEGATIVE NEGATIVE mg/dL   Nitrite NEGATIVE NEGATIVE   Leukocytes,Ua LARGE (A) NEGATIVE   RBC / HPF 11-20 0 - 5 RBC/hpf   WBC, UA 11-20 0 - 5 WBC/hpf   Bacteria, UA MANY (A) NONE SEEN   Squamous Epithelial / HPF 6-10 0 - 5 /HPF   Mucus PRESENT   CBC     Status: Abnormal   Collection Time: 04/19/23 12:25 AM  Result Value Ref Range   WBC 7.8 4.0 - 10.5 K/uL   RBC 5.38 (H) 3.87 - 5.11 MIL/uL   Hemoglobin 13.4 12.0 - 15.0 g/dL   HCT 09.8 11.9 - 14.7 %   MCV 73.0 (L) 80.0 - 100.0 fL   MCH 24.9 (L) 26.0 - 34.0 pg   MCHC 34.1 30.0 - 36.0 g/dL   RDW 82.9 56.2 - 13.0 %   Platelets 328 150 - 400 K/uL   nRBC 0.0 0.0 - 0.2 %  hCG, quantitative, pregnancy     Status: Abnormal   Collection Time: 04/19/23 12:25 AM  Result Value Ref Range   hCG, Beta Chain, Quant, S 89,200 (H) <5 mIU/mL  HIV Antibody (routine testing w rflx)     Status: None   Collection Time: 04/19/23 12:25 AM  Result Value Ref Range   HIV Screen 4th Generation wRfx Non Reactive Non Reactive       IMAGING US OB LESS THAN 14 WEEKS WITH OB TRANSVAGINAL Result Date: 04/19/2023 CLINICAL DATA:  Bleeding, lower abdominal pain EXAM: OBSTETRIC <14 WK [place] AND TRANSVAGINAL OB US TECHNIQUE: Both transabdominal and transvaginal ultrasound examinations were performed for complete evaluation of the gestation as well as the maternal uterus, adnexal regions, and pelvic cul-de-sac. Transvaginal technique was performed to assess early pregnancy. COMPARISON:  None Available. FINDINGS: Intrauterine gestational sac: Single Yolk sac:  Visualized. Embryo:  Visualized. Cardiac Activity: Visualized. Heart Rate: 145 bpm MSD:   mm    w     d CRL:  126 mm   7 w   4 d                  [place] EDC: 12/02/2023 Subchorionic hemorrhage:  None visualized. Maternal uterus/adnexae: No adnexal mass or free  fluid. IMPRESSION: Seven week 4 day intrauterine pregnancy. Fetal heart rate 145 beats per minute. No acute maternal findings. Electronically Signed   By: Charlett Nose M.D.   On: 04/19/2023 01:17     MAU Management/MDM:  I have reviewed the triage vital signs and the nursing notes.   Pertinent labs & imaging results that were available during my care of the patient were reviewed by me and considered in my medical decision making (see chart for details).      I have reviewed her medical records including past results, notes and treatments. Medical, Surgical, and family history were reviewed.  Medications and recent lab tests were reviewed  Ordered usual first trimester r/o ectopic labs.   Pelvic exam and cultures done Will check baseline Ultrasound to rule out ectopic.  This bleeding/pain can represent a normal pregnancy with bleeding, spontaneous abortion or even an ectopic which can be life-threatening.  The process as listed above helps to determine which of these is present.  Reviewed results and picture given Also reviewed Trichomonas seen on wet prep Discussed treatment and recommended partner be treated   ASSESSMENT Single IUP at [redacted]w[redacted]d Pelvic pain  Pregnancy of unknown location, now known to be IUP Trichominiasis  PLAN Discharge home Rx Flagyl x 7 days for Trichomonas List of OB providers given  Pt stable at time of discharge. Encouraged to return here if she develops worsening of symptoms, increase in pain, fever, or other concerning symptoms.    Wynelle Bourgeois CNM, MSN Certified Nurse-Midwife 04/19/2023  12:41 AM

## 2023-04-19 NOTE — Discharge Instructions (Signed)

## 2023-04-19 NOTE — MAU Note (Signed)
 Pt says feels sharp lower pain- started at 8pm-  Last time this happened - she had SAB No VB Pain- 10/10- no meds for pain Last sex- not in 72 hrs.

## 2023-04-20 LAB — CULTURE, OB URINE

## 2023-05-18 ENCOUNTER — Telehealth: Payer: Self-pay

## 2023-05-18 DIAGNOSIS — Z3A11 11 weeks gestation of pregnancy: Secondary | ICD-10-CM

## 2023-05-18 DIAGNOSIS — Z3481 Encounter for supervision of other normal pregnancy, first trimester: Secondary | ICD-10-CM

## 2023-05-18 DIAGNOSIS — Z3480 Encounter for supervision of other normal pregnancy, unspecified trimester: Secondary | ICD-10-CM | POA: Insufficient documentation

## 2023-05-18 NOTE — Progress Notes (Signed)
 New OB Intake  I connected with Maureen Morris  on 05/18/23 at 11:15 AM EDT by MyChart Video Visit and verified that I am speaking with the correct person using two identifiers. Nurse is located at [facility] and pt is located at home.  I discussed the limitations, risks, security and privacy concerns of performing an evaluation and management service by telephone and the availability of in person appointments. I also discussed with the patient that there may be a patient responsible charge related to this service. The patient expressed understanding and agreed to proceed.  I explained I am completing New OB Intake today. We discussed EDD of 12/02/2023, by Ultrasound. Pt is G7P1051. I reviewed her allergies, medications and Medical/Surgical/OB history.    Patient Active Problem List   Diagnosis Date Noted   Supervision of other normal pregnancy, antepartum 05/18/2023   Trichomoniasis 07/20/2015   Biological false positive RPR test 06/22/2015   Sinus bradycardia 08/15/2012    Concerns addressed today -Daughter has sickle cell trait  Delivery Plans Plans to deliver at [facility]. Discussed the nature of our practice with multiple providers including residents and students. Due to the size of the practice, the delivering provider may not be the same as those providing prenatal care.   Patient is not interested in water birth. Offered upcoming OB visit with CNM to discuss further.  MyChart/Babyscripts MyChart access verified. I explained pt will have some visits in office and some virtually. Babyscripts instructions given and order placed. Patient verifies receipt of registration text/e-mail. Account successfully created and app downloaded. If patient is a candidate for Optimized scheduling, add to sticky note.   Blood Pressure Cuff/Weight Scale Will apply for Medicaid and will order once Medicaid approve.   Anatomy US  Will message MFM staff to schedule any day of the week before 12 pm.    Is patient a CenteringPregnancy candidate?  Declined Declined due to Schedule Not a candidate due to    Is patient a Mom+Baby Combined Care candidate?  Not a candidate   If accepted, confirm patient does not intend to move from the area for at least 12 months, then notify Mom+Baby staff  Interested in [place]? If yes, send referral and doula dot phrase.   Is patient a candidate for Babyscripts Optimization? Yes, patient declined   First visit review I reviewed new OB appt with patient. Explained pt will be seen by Maureen Abler MD at first visit. Discussed Maureen Morris genetic screening with patient. Yes Panorama and [employer].. Routine prenatal labs is not   Last Pap Will do pap smear at her initial OB visit.   Maureen Morris, [place] 05/18/2023  11:51 AM

## 2023-05-25 ENCOUNTER — Encounter: Payer: Self-pay | Admitting: Obstetrics and Gynecology

## 2023-05-25 ENCOUNTER — Other Ambulatory Visit: Payer: Self-pay

## 2023-06-10 ENCOUNTER — Encounter: Payer: Self-pay | Admitting: Physician Assistant

## 2023-06-10 NOTE — Progress Notes (Deleted)
   PRENATAL VISIT NOTE  Subjective:  Maureen Morris is a 28 y.o. G7P1051 at [redacted]w[redacted]d being seen today for ongoing prenatal care.  She is currently monitored for the following issues for this {Blank single:19197::"high-risk","low-risk"} pregnancy and has Sinus bradycardia; Biological false positive RPR test; Trichomoniasis; and Supervision of other normal pregnancy, antepartum on their problem list.  Patient reports {sx:[id number]}.   .  .   . Denies leaking of fluid.   The following portions of the patient's history were reviewed and updated as appropriate: allergies, current medications, past family history, past medical history, past social history, past surgical history and problem list.   Objective:  There were no vitals filed for this visit.   Fetal Status:            General: Alert, oriented and cooperative. Patient is in no acute distress.  Skin: Skin is warm and dry. No rash noted.   Cardiovascular: Normal heart rate noted  Respiratory: Normal respiratory effort, no problems with respiration noted  Abdomen: Soft, gravid, appropriate for gestational age.        Pelvic: Cervical exam deferred        Extremities: Normal range of motion.     Mental Status: Normal mood and affect. Normal behavior. Normal judgment and thought content.   Assessment and Plan:  Pregnancy: G7P1051 at [redacted]w[redacted]d  1. Supervision of other normal pregnancy, antepartum (Primary) Patient doing well, feeling regular fetal movement BP, FHR, FH appropriate  2. [redacted] weeks gestation of pregnancy Anticipatory guidance about next visits/weeks of pregnancy given.   3. Trichomoniasis ***  Preterm labor symptoms and general obstetric precautions including but not limited to vaginal bleeding, contractions, leaking of fluid and fetal movement were reviewed in detail with the patient. Please refer to After Visit Summary for other counseling recommendations.   No follow-ups on file.  Future Appointments  Date Time Provider  Department Center  06/10/2023  9:55 AM Beyla Loney E, PA-C [facility]  07/12/2023  1:00 PM [facility] PROVIDER 1 [facility]  07/12/2023  1:30 PM [facility] US3 [facility]    Luevenia Saha, PA-C

## 2023-07-12 ENCOUNTER — Ambulatory Visit: Payer: Self-pay

## 2023-07-12 ENCOUNTER — Other Ambulatory Visit: Payer: Self-pay

## 2023-07-12 DIAGNOSIS — Z3480 Encounter for supervision of other normal pregnancy, unspecified trimester: Secondary | ICD-10-CM

## 2023-09-01 LAB — OB RESULTS CONSOLE HEPATITIS B SURFACE ANTIGEN: Hepatitis B Surface Ag: NEGATIVE

## 2023-09-01 LAB — OB RESULTS CONSOLE RUBELLA ANTIBODY, IGM: Rubella: IMMUNE

## 2023-09-01 LAB — HEPATITIS C ANTIBODY: HCV Ab: NEGATIVE

## 2023-10-14 ENCOUNTER — Inpatient Hospital Stay (HOSPITAL_COMMUNITY)
Admission: AD | Admit: 2023-10-14 | Discharge: 2023-10-15 | Disposition: A | Discharge status: Home / Self Care | Attending: Obstetrics and Gynecology | Admitting: Obstetrics and Gynecology

## 2023-10-14 DIAGNOSIS — K529 Noninfective gastroenteritis and colitis, unspecified: Secondary | ICD-10-CM | POA: Diagnosis not present

## 2023-10-14 DIAGNOSIS — Z3A33 33 weeks gestation of pregnancy: Secondary | ICD-10-CM | POA: Diagnosis not present

## 2023-10-14 DIAGNOSIS — O4703 False labor before 37 completed weeks of gestation, third trimester: Secondary | ICD-10-CM | POA: Insufficient documentation

## 2023-10-14 DIAGNOSIS — Z0371 Encounter for suspected problem with amniotic cavity and membrane ruled out: Secondary | ICD-10-CM | POA: Diagnosis present

## 2023-10-14 DIAGNOSIS — O99891 Other specified diseases and conditions complicating pregnancy: Secondary | ICD-10-CM | POA: Insufficient documentation

## 2023-10-14 DIAGNOSIS — Z3493 Encounter for supervision of normal pregnancy, unspecified, third trimester: Secondary | ICD-10-CM

## 2023-10-15 ENCOUNTER — Encounter (HOSPITAL_COMMUNITY): Payer: Self-pay | Admitting: Obstetrics and Gynecology

## 2023-10-15 DIAGNOSIS — K529 Noninfective gastroenteritis and colitis, unspecified: Secondary | ICD-10-CM

## 2023-10-15 DIAGNOSIS — O4703 False labor before 37 completed weeks of gestation, third trimester: Secondary | ICD-10-CM

## 2023-10-15 DIAGNOSIS — Z3A33 33 weeks gestation of pregnancy: Secondary | ICD-10-CM

## 2023-10-15 LAB — CBC
HCT: 34.5 % — ABNORMAL LOW (ref 36.0–46.0)
Hemoglobin: 11.5 g/dL — ABNORMAL LOW (ref 12.0–15.0)
MCH: 24.8 pg — ABNORMAL LOW (ref 26.0–34.0)
MCHC: 33.3 g/dL (ref 30.0–36.0)
MCV: 74.5 fL — ABNORMAL LOW (ref 80.0–100.0)
Platelets: 293 K/uL (ref 150–400)
RBC: 4.63 MIL/uL (ref 3.87–5.11)
RDW: 13.2 % (ref 11.5–15.5)
WBC: 9.9 K/uL (ref 4.0–10.5)
nRBC: 0 % (ref 0.0–0.2)

## 2023-10-15 LAB — COMPREHENSIVE METABOLIC PANEL WITH GFR
ALT: 24 U/L (ref 0–44)
AST: 19 U/L (ref 15–41)
Albumin: 2.6 g/dL — ABNORMAL LOW (ref 3.5–5.0)
Alkaline Phosphatase: 130 U/L — ABNORMAL HIGH (ref 38–126)
Anion gap: 13 (ref 5–15)
BUN: 7 mg/dL (ref 6–20)
CO2: 17 mmol/L — ABNORMAL LOW (ref 22–32)
Calcium: 8.8 mg/dL — ABNORMAL LOW (ref 8.9–10.3)
Chloride: 106 mmol/L (ref 98–111)
Creatinine, Ser: 0.58 mg/dL (ref 0.44–1.00)
GFR, Estimated: >60 mL/min (ref >60)
Glucose, Bld: 94 mg/dL (ref 70–99)
Potassium: 3.9 mmol/L (ref 3.5–5.1)
Sodium: 136 mmol/L (ref 135–145)
Total Bilirubin: 0.3 mg/dL (ref 0.0–1.2)
Total Protein: 6.2 g/dL — ABNORMAL LOW (ref 6.5–8.1)

## 2023-10-15 LAB — URINALYSIS, ROUTINE W REFLEX MICROSCOPIC
Bilirubin Urine: NEGATIVE
Glucose, UA: NEGATIVE mg/dL
Hgb urine dipstick: NEGATIVE
Ketones, ur: NEGATIVE mg/dL
Leukocytes,Ua: NEGATIVE
Nitrite: NEGATIVE
Protein, ur: NEGATIVE mg/dL
Specific Gravity, Urine: 1.01 (ref 1.005–1.030)
pH: 7 (ref 5.0–8.0)

## 2023-10-15 LAB — WET PREP, GENITAL
Sperm: NONE SEEN
Trich, Wet Prep: NONE SEEN
WBC, Wet Prep HPF POC: >=10 — AB (ref <10)
Yeast Wet Prep HPF POC: NONE SEEN

## 2023-10-15 LAB — RUPTURE OF MEMBRANE (ROM)PLUS: Rom Plus: NEGATIVE

## 2023-10-15 MED ORDER — ONDANSETRON 4 MG PO TBDP
8.0000 mg | ORAL_TABLET | Freq: Once | ORAL | Status: AC
  Administered 2023-10-15: 8 mg via ORAL
  Filled 2023-10-15: qty 2

## 2023-10-15 MED ORDER — METRONIDAZOLE 500 MG PO TABS: 500.0000 mg | ORAL_TABLET | Freq: Two times a day (BID) | ORAL | 0 refills | Status: DC

## 2023-10-15 MED ORDER — ACETAMINOPHEN-CAFFEINE 500-65 MG PO TABS
2.0000 | ORAL_TABLET | Freq: Once | ORAL | Status: AC
  Administered 2023-10-15: 2 via ORAL
  Filled 2023-10-15: qty 2

## 2023-10-15 MED ORDER — ONDANSETRON HCL 4 MG PO TABS: 8.0000 mg | ORAL_TABLET | Freq: Two times a day (BID) | ORAL | 0 refills | Status: DC

## 2023-10-15 NOTE — MAU Note (Signed)
 Pt says at 7pm- she was moving things- going up steps - fluid came out of her vagina . Unsure if still has fluid coming out.  Feels UC's - [facility] Last sex- mths ago  Last time baby moved - 8pm.

## 2023-10-15 NOTE — MAU Provider Note (Signed)
 Chief Complaint:  Contractions and Rupture of Membranes   HPI    Maureen Morris is a 28 y.o. H2E8948 at [redacted]w[redacted]d who presents to maternity admissions reporting reporting around 7 PM she felt some fluid coming out she is unsure why if it was urinary versus rupture membranes.  Patient states she also started feeling contractions that started at 11 PM.  She denies any vaginal bleeding, and reports good fetal movements.   Pregnancy Course: [facility]  Past Medical History:  Diagnosis Date   Anxiety    Depression    Eczema    Late prenatal care complicating pregnancy 04/18/2012   Medical history non-contributory    Pregnancy, supervision for, high-risk 04/24/2012   Clinic:[facility]         Genetic Screen    NT:                            First Screen:               Quad Screen/MSAFP:      Anatomic US     Need f/u sono in 2 weeks for incomplete anatomy (04/20/12)      Glucose Screen    Normal 77      GBS           Feeding Preference           Contraception    Mirena       Circumcision    Yes if its a boy              SVD (spontaneous vaginal delivery) 08/15/2012   UTI (urinary tract infection)    OB History  Gravida Para Term Preterm AB Living  7 1 1  5 1   SAB IAB Ectopic Multiple Live Births  2 3   1     # Outcome Date GA Lbr Len/2nd Weight Sex Type Anes PTL Lv  7 Current           6 Term 08/13/12 [redacted]w[redacted]d 05:41 / 00:07 2560 g F Vag-Spont EPI  LIV  5 SAB           4 SAB           3 IAB           2 IAB           1 IAB             Obstetric Comments  4 elective AB- procedures, no complications   Past Surgical History:  Procedure Laterality Date   THERAPEUTIC ABORTION     4 elective   Family History  Problem Relation Age of Onset   Hypertension Mother    Diabetes Mother    Hypertension Father    Anxiety disorder Sister    ADD / ADHD Brother    Bipolar disorder Brother    Schizophrenia Brother    Breast cancer Maternal Grandmother    Social History   Tobacco Use   Smoking status:  Former    Current packs/day: 0.00    Types: Cigarettes    Quit date: 11/02/2014    Years since quitting: 8.9   Smokeless tobacco: Never   Tobacco comments:    Stopped with +preg  test  Vaping Use   Vaping status: Never Used  Substance Use Topics   Alcohol use: Not Currently    Comment: Once a week.    Drug use: Not Currently    Types: Marijuana  Comment: stopped with preg, early Oct   No Known Allergies No medications prior to admission.    I have reviewed patient's Past Medical Hx, Surgical Hx, Family Hx, Social Hx, medications and allergies.   ROS  Pertinent items noted in HPI and remainder of comprehensive ROS otherwise negative.   PHYSICAL EXAM  Patient Vitals for the past 24 hrs:  BP Temp Temp src Pulse Resp Height Weight  10/15/23 0014 126/85 98.2 F (36.8 C) Oral 86 16 5' 7 (1.702 m) 108.3 kg    Constitutional: Well-developed, well-nourished obese female in no acute distress.  Cardiovascular: normal rate & rhythm, warm and well-perfused Respiratory: normal effort, no problems with respiration noted GI: Abd soft, non-tender, gravid MS: Extremities nontender, no edema, normal ROM Neurologic: Alert and oriented x 4.  GU: no CVA tenderness Pelvic: chaperoned by Benton Medley RN  Dilation: 2 Effacement (%): 50 Station: -3 Exam by:: [employer], NPNo pooling, scant yellowish discharge that appears to be yeast like, no evidence of blood and cervix is visually closed SVE: 2/50/-3   Fetal Tracing: Cat 1 reactive Baseline:130 Variability:moderate  Accelerations: present Decelerations: absent Toco: no ctx   Labs: Results for orders placed or performed during the hospital encounter of 10/14/23 (from the past 24 hours)  Urinalysis, Routine w reflex microscopic -Urine, Clean Catch     Status: Abnormal   Collection Time: 10/15/23 12:32 AM  Result Value Ref Range   Color, Urine STRAW (A) YELLOW   APPearance CLEAR CLEAR   Specific Gravity, Urine 1.010 1.005 -  1.030   pH 7.0 5.0 - 8.0   Glucose, UA NEGATIVE NEGATIVE mg/dL   Hgb urine dipstick NEGATIVE NEGATIVE   Bilirubin Urine NEGATIVE NEGATIVE   Ketones, ur NEGATIVE NEGATIVE mg/dL   Protein, ur NEGATIVE NEGATIVE mg/dL   Nitrite NEGATIVE NEGATIVE   Leukocytes,Ua NEGATIVE NEGATIVE  Rupture of Membrane (ROM) Plus     Status: None   Collection Time: 10/15/23 12:33 AM  Result Value Ref Range   Rom Plus NEGATIVE   Wet prep, genital     Status: Abnormal   Collection Time: 10/15/23 12:33 AM  Result Value Ref Range   Yeast Wet Prep HPF POC NONE SEEN NONE SEEN   Trich, Wet Prep NONE SEEN NONE SEEN   Clue Cells Wet Prep HPF POC PRESENT (A) NONE SEEN   WBC, Wet Prep HPF POC >=10 (A) <10   Sperm NONE SEEN   CBC     Status: Abnormal   Collection Time: 10/15/23  1:44 AM  Result Value Ref Range   WBC 9.9 4.0 - 10.5 K/uL   RBC 4.63 3.87 - 5.11 MIL/uL   Hemoglobin 11.5 (L) 12.0 - 15.0 g/dL   HCT 65.4 (L) 63.9 - 53.9 %   MCV 74.5 (L) 80.0 - 100.0 fL   MCH 24.8 (L) 26.0 - 34.0 pg   MCHC 33.3 30.0 - 36.0 g/dL   RDW 86.7 88.4 - 84.4 %   Platelets 293 150 - 400 K/uL   nRBC 0.0 0.0 - 0.2 %    Imaging:  No results found.  MDM & MAU COURSE  MDM:  HIGH  R/O PTL Labor, R/O PPROM  Prenatal chart reviewed  Physical exam performed pelvic Vaginal cultures obtained ( c/w BV) - Plan for rx at discharge ROM plus obtained ( Negative) CBC/CMP ordered for recent nausea vomiting Zofran  for N/V Excedrin for HA NST for your age and fetal reassurance (category 1 reactive tracing, no ctx on toco)  No evidence of PPROM or preterm labor based on negative ROM plus and benign exam with no contractions on toco Wet prep consistent with BV Probable stomach GI gastroenteritis due to recent nausea and emesis and likely abdominal pain is related to GI gastroenteritis.  MAU Course: Orders Placed This Encounter  Procedures   Wet prep, genital   Urinalysis, Routine w reflex microscopic -Urine, Clean Catch    Rupture of Membrane (ROM) Plus   CBC   Comprehensive metabolic panel   Discharge patient Discharge disposition: 01-Home or Self Care; Discharge patient date: 10/15/2023   Meds ordered this encounter  Medications   ondansetron  (ZOFRAN -ODT) disintegrating tablet 8 mg   acetaminophen -caffeine  (EXCEDRIN TENSION HEADACHE) 500-65 MG per tablet 2 tablet   ondansetron  (ZOFRAN ) 4 MG tablet    Sig: Take 2 tablets (8 mg total) by mouth 2 (two) times daily.    Dispense:  20 tablet    Refill:  0    Supervising Provider:   PRATT, TANYA S [[id number]]   metroNIDAZOLE  (FLAGYL ) 500 MG tablet    Sig: Take 1 tablet (500 mg total) by mouth 2 (two) times daily.    Dispense:  14 tablet    Refill:  0    Supervising Provider:   PRATT, TANYA S [[id number]]      I have reviewed the patient chart and performed the physical exam . I have ordered & interpreted the lab results and reviewed and interpreted the NST Medications ordered as stated below.  A/P as described below.  Counseling and education provided and patient agreeable  with plan as described below. Verbalized understanding.    ASSESSMENT   1. Intact amniotic membranes during pregnancy in third trimester   2. Gastroenteritis   3. [redacted] weeks gestation of pregnancy   4. False labor before 37 completed weeks of gestation in third trimester      PLAN  Discharge home in stable condition with return precautions.   F/U with OB   See AVS for full description of information given to the patient including both verbal and written. Patient verbalized understanding and agrees with the plan as described above.      Allergies as of 10/15/2023   No Known Allergies      Medication List     TAKE these medications    metroNIDAZOLE  500 MG tablet Commonly known as: FLAGYL  Take 1 tablet (500 mg total) by mouth 2 (two) times daily.   ondansetron  4 MG tablet Commonly known as: Zofran  Take 2 tablets (8 mg total) by mouth 2 (two) times daily.        Olam Dalton, MSN, [facility], Center for [facility]    This chart was dictated using voice recognition software, Dragon. Despite the best efforts of this provider to proofread and correct errors, errors may still occur which can change documentation meaning.

## 2023-10-17 LAB — GC/CHLAMYDIA PROBE AMP (~~LOC~~) NOT AT ARMC
Chlamydia: NEGATIVE
Comment: NEGATIVE
Comment: NORMAL
Neisseria Gonorrhea: NEGATIVE

## 2023-10-21 ENCOUNTER — Other Ambulatory Visit: Payer: Self-pay | Admitting: *Deleted

## 2023-10-21 ENCOUNTER — Ambulatory Visit (HOSPITAL_BASED_OUTPATIENT_CLINIC_OR_DEPARTMENT_OTHER): Payer: Self-pay | Admitting: Maternal & Fetal Medicine

## 2023-10-21 ENCOUNTER — Ambulatory Visit: Payer: Self-pay | Attending: Obstetrics and Gynecology

## 2023-10-21 ENCOUNTER — Ambulatory Visit (HOSPITAL_BASED_OUTPATIENT_CLINIC_OR_DEPARTMENT_OTHER): Payer: Self-pay

## 2023-10-21 VITALS — BP 121/70 | HR 84

## 2023-10-21 DIAGNOSIS — Z3A34 34 weeks gestation of pregnancy: Secondary | ICD-10-CM

## 2023-10-21 DIAGNOSIS — O99013 Anemia complicating pregnancy, third trimester: Secondary | ICD-10-CM

## 2023-10-21 DIAGNOSIS — Z148 Genetic carrier of other disease: Secondary | ICD-10-CM | POA: Diagnosis not present

## 2023-10-21 DIAGNOSIS — Z3480 Encounter for supervision of other normal pregnancy, unspecified trimester: Secondary | ICD-10-CM | POA: Insufficient documentation

## 2023-10-21 DIAGNOSIS — O99213 Obesity complicating pregnancy, third trimester: Secondary | ICD-10-CM

## 2023-10-21 DIAGNOSIS — D573 Sickle-cell trait: Secondary | ICD-10-CM | POA: Insufficient documentation

## 2023-10-21 DIAGNOSIS — E669 Obesity, unspecified: Secondary | ICD-10-CM

## 2023-10-21 DIAGNOSIS — Z3A11 11 weeks gestation of pregnancy: Secondary | ICD-10-CM | POA: Insufficient documentation

## 2023-10-21 DIAGNOSIS — Z3689 Encounter for other specified antenatal screening: Secondary | ICD-10-CM | POA: Insufficient documentation

## 2023-10-21 DIAGNOSIS — D5740 Sickle-cell thalassemia without crisis: Secondary | ICD-10-CM

## 2023-10-21 DIAGNOSIS — Z363 Encounter for antenatal screening for malformations: Secondary | ICD-10-CM | POA: Diagnosis not present

## 2023-10-21 DIAGNOSIS — D563 Thalassemia minor: Secondary | ICD-10-CM

## 2023-10-21 DIAGNOSIS — O99019 Anemia complicating pregnancy, unspecified trimester: Secondary | ICD-10-CM | POA: Diagnosis not present

## 2023-10-21 DIAGNOSIS — O358XX0 Maternal care for other (suspected) fetal abnormality and damage, not applicable or unspecified: Secondary | ICD-10-CM

## 2023-10-21 NOTE — Progress Notes (Signed)
 [facility] at [facility] [address] Phone:  [phone]   Fax:  [phone]      In-Person Genetic Counseling Clinic Note:   I spoke with 28 y.o. Maureen Morris today to discuss her carrier screening results. She was referred by Maureen Parson, FNP. She was accompanied by FOB Maureen Morris.   Pregnancy History:    H1E8938. EGA: [redacted]w[redacted]d by US . EDD: 12/02/2023. She has one daughter with sickle cell disease. She reports 6 IABs due to personal reasons. Reports she takes PNVs. Denies major personal health concerns. Denies personal history of diabetes, high blood pressure, thyroid conditions, and seizures. Denies bleeding, infections, and fevers in this pregnancy. Denies using tobacco, alcohol, or street drugs in this pregnancy.   Family History:    A three-generation pedigree was created and scanned into Epic under the Media tab.  Patient reports her 77 year old daughter has sickle cell disease. Please see sickle cell discussion below.  Patient reports her 106 yo brother was diagnosed with autism at a young age. No know genetics. He has no health concerns. We discussed that we are unable to directly test for autism in a pregnancy. Genetic testing for individuals with a clinical diagnosis of autism yields an explanation in only about 20% of cases, and the remaining 80% of cases are left with unknown etiology. Having an affected family member may increase the chance that Maureen Morris's children will have autism; however, without genetic testing performed on affected family members, it is difficult to assess risk to the pregnancy and other family members. The risk may be up to 50% in the case of an identified genetic cause. We also discussed and offered screening for fragile X syndrome, which is one of the most common causes of inherited intellectual disability and autism in males. Fragile X syndrome affects females as well. Maureen Morris declined  fragile X syndrome carrier screening.  FOB reports he has a history of stomach concerns that seem to be maternally inherited. He reports he is the most severely affected within his family.  He reports he goes through periods of time in which he cannot eat and vomits regularly.  Reports that he has been followed for this; however, no official diagnosis was made.  Without a known medical condition or genetic testing reports, recurrence risk is difficult to estimate.  FOB reports his paternal half sister has epilepsy that was diagnosed at 30 years old.  She sees a specialist, no known genetics performed. Seizures can occur secondary to a variety of environmental, lifestyle, and genetic factors. When epilepsy does not have an identified genetic cause, the chance that a first degree relative of someone with epilepsy will also have or develop epilepsy is approximately 2-5%. Given that FOB's half sister is a third degree relative to her fetus, recurrence risk for epilepsy may be slightly elevated over the general population risk of 1%. However, without knowing the etiology of the seizures in the family, precise risk assessment is limited.  Maternal ethnicity reported as Black and paternal ethnicity reported as Black. Denies Ashkenazi Jewish ancestry.  Family history not remarkable for consanguinity, individuals with birth defects, intellectual disability, autism spectrum disorder, multiple spontaneous abortions, still births, or unexplained neonatal death.   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  We reviewed the genetics  of alpha thalassemia, autosomal recessive mode of inheritance, and clinical features of these conditions. We reviewed that Maureen Morris will either pass down two copies of the alpha globin gene (??) OR one copy of the alpha  globin gene (-?) in each pregnancy. Therefore, this pregnancy is not at increased risk for hemoglobin Bart's due to four deletions of the alpha globin genes (--/--) regardless of her reproductive partner's carrier status. The pregnancy will be at increased risk (25%) for hemoglobin H disease if her partner is an alpha thalassemia carrier in the cis configuration (--/??). We reviewed that this is more common in [place] Asian populations and less common in those with Black ancestry.  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 Therefore, she makes both the typical hemoglobin A and the atypical hemoglobin S (HbA/S).  We reviewed beta globin genes, hemoglobin, forms of beta-hemoglobinopathies and their natural histories, and the autosomal mode of inheritance. Maureen Morris will either pass down either the normal beta globin gene that produces the normal hemoglobin A or the altered beta globin gene that produces hemoglobin S. There would be a 25% risk for the pregnancy to be affected with sickle cell disease (HbS/S) if Maureen Morris's reproductive partner is also a carrier. There are several types of beta-hemoglobinopathies, including hemoglobin C, hemoglobin O, hemoglobin E, and beta-thalassemia. If Maureen Morris's reproductive partner is found to be a carrier for for a beta-hemoglobinopathy, there would be a 25% chance that this pregnancy would be affected. The type of beta-hemoglobinopathy and symptoms will vary depending on the genotype.   FOB carrier screening offered: Given these results, we discussed and offered carrier screening for Maureen Morris's reproductive partner. We reviewed the benefits and limitations of carrier screening and that it can detect most but not all carriers.  The couple consented carrier screening for FOB. His blood was drawn during today's visit. He consented that we call Maureen Morris with the  results. We reviewed that the [place]  Newborn Screening (NBS) program will screen all newborn babies for cystic fibrosis, spinal muscular atrophy, hemoglobinopathies, and numerous other conditions but will not screen for alpha thalassemia.  In the meantime, we calculated the risk for the current pregnancy to be affected with a beta-hemoglobinopathy. Based on Rylinn's carrier screening results and her partner's ethnicity, the current pregnancy is at a 1 in 40 (~2.5%) risk to be affected.  Of note, Idaliz was not found to be a carrier for the other two conditions screened for (CF and SMA). This significantly reduces but does not eliminate the chance of being a carrier. Please see report for details.    Newborn Screening. The [place]  Newborn Screening (NBS) program will screen all newborn babies for cystic fibrosis, spinal muscular atrophy, hemoglobinopathies, and numerous other conditions.  Previous Testing Completed:  Low risk NIPS: Keylen previously completed noninvasive prenatal screening (NIPS) in this pregnancy. The result is low risk, consistent with a female fetus. This screening significantly reduces but does not eliminate the chance that the current pregnancy has Down syndrome (trisomy 1), trisomy 62, trisomy 74, common sex chromosome conditions, and 22q11.2 microdeletion syndrome. Please see report for details. There are many genetic conditions that cannot be detected by NIPS.    Plan of Care:   FOB carrier screening for alpha thalassemia and beta-hemoglobinopathies (including sickle cell) drawn today. We will call the couple with the results. Newborn screening for beta-hemoglobinopathies. Routine prenatal care.  Informed consent was obtained. All questions were answered.   65 minutes were spent on the date of the encounter in service to the patient including preparation, face-to-face consultation, discussion of test reports and available next steps, pedigree  construction, genetic risk assessment, documentation, and care coordination.    Thank you for sharing in the care of Devanee with us .  Please do not hesitate to contact us  at [phone] if you have any questions.   [name], MS, [facility] Certified Genetic Counselor   Genetic counseling student involved in appointment: No.

## 2023-10-21 NOTE — Progress Notes (Signed)
 Patient information  Patient Name: Maureen Morris  Patient MRN:   [id number]  Referring practice: MFM Referring Provider: [facility])  Problem List   Patient Active Problem List   Diagnosis Date Noted   Supervision of other normal pregnancy, antepartum 05/18/2023   Trichomoniasis 07/20/2015   Biological false positive RPR test 06/22/2015   Sinus bradycardia 08/15/2012    Maternal Fetal medicine Consult  Maureen Morris is a 28 y.o. G7P1051 at [redacted]w[redacted]d here for ultrasound and consultation. Maureen Morris is doing well today with no acute concerns. Today we focused on the following:   The patient missed her anatomy US . I discussed this makes visualization of the fetal anatomy difficult and much of the fetal anatomy remains suboptimally visualized.  She is a carrier for alpha thalassemia silent carrier as well as sickle cell trait.  She has an appoint with a Dentist for today's visit.  She will follow-up in 4 weeks to attempt clearing anatomy.  Sonographic findings Single intrauterine pregnancy at 34w 0d  Fetal cardiac activity:  Observed and appears normal. Presentation: Cephalic. The anatomic structures that were well seen appear normal without evidence of soft markers. Due to poor acoustic windows some structures remain suboptimally visualized. Fetal biometry shows the estimated fetal weight at the 43 percentile.  Amniotic fluid: Within normal limits.  MVP: 5.35 cm. Placenta: Posterior. Adnexa: No adnexal mass visualized.   There are limitations of prenatal ultrasound such as the inability to detect certain abnormalities due to poor visualization. Various factors such as fetal position, gestational age and maternal body habitus may increase the difficulty in visualizing the fetal anatomy.    Recommendations - EDD should be 12/02/2023 based on  Early Ultrasound  (04/19/23). - GC after today's visit - F/u growth US  in 4 weeks  Review  of Systems: A review of systems was performed and was negative except per HPI   Vitals and Physical Exam    10/21/2023    1:06 PM 10/15/2023   12:14 AM 04/19/2023   12:15 AM  Vitals with BMI  Height  5' 7 5' 7  Weight  238 lbs 11 oz 221 lbs  BMI  37.38 34.61  Systolic 121 126 879  Diastolic 70 85 84  Pulse 84 86 54    Sitting comfortably on the sonogram table Nonlabored breathing Normal rate and rhythm Abdomen is nontender  Past pregnancies OB History  Gravida Para Term Preterm AB Living  7 1 1  5 1   SAB IAB Ectopic Multiple Live Births  2 3   1     # Outcome Date GA Lbr Len/2nd Weight Sex Type Anes PTL Lv  7 Current           6 Term 08/13/12 [redacted]w[redacted]d 05:41 / 00:07 5 lb 10.3 oz (2.56 kg) F Vag-Spont EPI  LIV  5 SAB           4 SAB           3 IAB           2 IAB           1 IAB             Obstetric Comments  4 elective AB- procedures, no complications     I spent 30 minutes reviewing the patients chart, including labs and images as well as counseling the patient about her medical conditions. Greater than 50% of the time was spent in direct face-to-face patient counseling.  Delora Smaller  MFM, [facility]   10/21/2023  2:14 PM

## 2023-10-25 ENCOUNTER — Telehealth: Payer: Self-pay | Admitting: Family Medicine

## 2023-10-25 NOTE — Telephone Encounter (Signed)
 Called the patient to confirm if she was receiving OB Care at a different facility and she confirmed that she is going to the health Department. Will be stopping by tomorrow to sign the transfer release form to start prenatal care at our office.

## 2023-11-07 LAB — OB RESULTS CONSOLE GBS
GBS: NEGATIVE
GBS: NEGATIVE

## 2023-11-07 LAB — OB RESULTS CONSOLE GC/CHLAMYDIA
Chlamydia: NEGATIVE
Neisseria Gonorrhea: NEGATIVE

## 2023-11-14 ENCOUNTER — Telehealth: Payer: Self-pay

## 2023-11-14 NOTE — Telephone Encounter (Signed)
 I called the patient to discuss her partner's carrier screening results Maureen Morris DOB 01/15/1983). He was not found to be a carrier for alpha thalassemia and beta-hemoglobinopathies (including sickle cell). Please see report for details. A negative result on carrier screening reduces but does not eliminate the chance of being a carrier. The chance this couple's current and future pregnancies would be affected with these conditions is very low.  [name], MS, [facility] Certified Genetic Counselor [facility] [phone]

## 2023-11-17 ENCOUNTER — Inpatient Hospital Stay (HOSPITAL_COMMUNITY)
Admission: AD | Admit: 2023-11-17 | Discharge: 2023-11-19 | DRG: 807 | Disposition: A | Attending: Obstetrics and Gynecology | Admitting: Obstetrics and Gynecology

## 2023-11-17 ENCOUNTER — Inpatient Hospital Stay (HOSPITAL_COMMUNITY): Admitting: Anesthesiology

## 2023-11-17 ENCOUNTER — Encounter (HOSPITAL_COMMUNITY): Payer: Self-pay | Admitting: Obstetrics and Gynecology

## 2023-11-17 ENCOUNTER — Other Ambulatory Visit: Payer: Self-pay

## 2023-11-17 DIAGNOSIS — D573 Sickle-cell trait: Secondary | ICD-10-CM | POA: Diagnosis present

## 2023-11-17 DIAGNOSIS — Z833 Family history of diabetes mellitus: Secondary | ICD-10-CM | POA: Diagnosis not present

## 2023-11-17 DIAGNOSIS — Z30430 Encounter for insertion of intrauterine contraceptive device: Secondary | ICD-10-CM | POA: Diagnosis not present

## 2023-11-17 DIAGNOSIS — Z7982 Long term (current) use of aspirin: Secondary | ICD-10-CM | POA: Diagnosis not present

## 2023-11-17 DIAGNOSIS — Z3480 Encounter for supervision of other normal pregnancy, unspecified trimester: Principal | ICD-10-CM

## 2023-11-17 DIAGNOSIS — O9902 Anemia complicating childbirth: Secondary | ICD-10-CM | POA: Diagnosis present

## 2023-11-17 DIAGNOSIS — O4290 Premature rupture of membranes, unspecified as to length of time between rupture and onset of labor, unspecified weeks of gestation: Secondary | ICD-10-CM | POA: Diagnosis present

## 2023-11-17 DIAGNOSIS — O4292 Full-term premature rupture of membranes, unspecified as to length of time between rupture and onset of labor: Principal | ICD-10-CM | POA: Diagnosis present

## 2023-11-17 DIAGNOSIS — Z3A37 37 weeks gestation of pregnancy: Secondary | ICD-10-CM

## 2023-11-17 DIAGNOSIS — Z148 Genetic carrier of other disease: Secondary | ICD-10-CM | POA: Diagnosis not present

## 2023-11-17 DIAGNOSIS — O26893 Other specified pregnancy related conditions, third trimester: Secondary | ICD-10-CM | POA: Diagnosis present

## 2023-11-17 DIAGNOSIS — F32A Depression, unspecified: Secondary | ICD-10-CM | POA: Diagnosis present

## 2023-11-17 DIAGNOSIS — Z8249 Family history of ischemic heart disease and other diseases of the circulatory system: Secondary | ICD-10-CM

## 2023-11-17 DIAGNOSIS — O4202 Full-term premature rupture of membranes, onset of labor within 24 hours of rupture: Secondary | ICD-10-CM | POA: Diagnosis not present

## 2023-11-17 DIAGNOSIS — Z87891 Personal history of nicotine dependence: Secondary | ICD-10-CM

## 2023-11-17 LAB — TYPE AND SCREEN
ABO/RH(D): B POS
Antibody Screen: NEGATIVE

## 2023-11-17 LAB — CBC
HCT: 33.9 % — ABNORMAL LOW (ref 36.0–46.0)
Hemoglobin: 11.3 g/dL — ABNORMAL LOW (ref 12.0–15.0)
MCH: 24.5 pg — ABNORMAL LOW (ref 26.0–34.0)
MCHC: 33.3 g/dL (ref 30.0–36.0)
MCV: 73.4 fL — ABNORMAL LOW (ref 80.0–100.0)
Platelets: 288 K/uL (ref 150–400)
RBC: 4.62 MIL/uL (ref 3.87–5.11)
RDW: 14.2 % (ref 11.5–15.5)
WBC: 9.2 K/uL (ref 4.0–10.5)
nRBC: 0 % (ref 0.0–0.2)

## 2023-11-17 LAB — POCT FERN TEST: POCT Fern Test: POSITIVE

## 2023-11-17 MED ORDER — OXYCODONE-ACETAMINOPHEN 5-325 MG PO TABS: 2.0000 | ORAL_TABLET | ORAL | Status: DC | PRN

## 2023-11-17 MED ORDER — DIPHENHYDRAMINE HCL 50 MG/ML IJ SOLN: 12.5000 mg | INTRAMUSCULAR | Status: DC | PRN

## 2023-11-17 MED ORDER — FENTANYL CITRATE (PF) 100 MCG/2ML IJ SOLN: 50.0000 ug | INTRAMUSCULAR | Status: DC | PRN

## 2023-11-17 MED ORDER — OXYTOCIN BOLUS FROM INFUSION
333.0000 mL | Freq: Once | INTRAVENOUS | Status: AC
  Administered 2023-11-18: 333 mL via INTRAVENOUS

## 2023-11-17 MED ORDER — PHENYLEPHRINE 80 MCG/ML (10ML) SYRINGE FOR IV PUSH (FOR BLOOD PRESSURE SUPPORT): 80.0000 ug | PREFILLED_SYRINGE | INTRAVENOUS | Status: DC | PRN

## 2023-11-17 MED ORDER — PHENYLEPHRINE 80 MCG/ML (10ML) SYRINGE FOR IV PUSH (FOR BLOOD PRESSURE SUPPORT)
80.0000 ug | PREFILLED_SYRINGE | INTRAVENOUS | Status: AC | PRN
Start: 2023-11-17 — End: ?

## 2023-11-17 MED ORDER — OXYCODONE-ACETAMINOPHEN 5-325 MG PO TABS: 1.0000 | ORAL_TABLET | ORAL | Status: DC | PRN

## 2023-11-17 MED ORDER — OXYTOCIN-SODIUM CHLORIDE 30-0.9 UT/500ML-% IV SOLN
2.5000 [IU]/h | INTRAVENOUS | Status: DC
  Administered 2023-11-18: 2.5 [IU]/h via INTRAVENOUS

## 2023-11-17 MED ORDER — LIDOCAINE HCL (PF) 1 % IJ SOLN
INTRAMUSCULAR | Status: DC | PRN
  Administered 2023-11-17: 8 mL via EPIDURAL

## 2023-11-17 MED ORDER — ONDANSETRON HCL 4 MG/2ML IJ SOLN: 4.0000 mg | Freq: Four times a day (QID) | INTRAMUSCULAR | Status: DC | PRN

## 2023-11-17 MED ORDER — LACTATED RINGERS IV SOLN: 500.0000 mL | INTRAVENOUS | Status: DC | PRN

## 2023-11-17 MED ORDER — HYDROXYZINE HCL 50 MG PO TABS: 50.0000 mg | ORAL_TABLET | Freq: Four times a day (QID) | ORAL | Status: DC | PRN

## 2023-11-17 MED ORDER — EPHEDRINE 5 MG/ML INJ
10.0000 mg | INTRAVENOUS | Status: AC | PRN
Start: 2023-11-17 — End: ?

## 2023-11-17 MED ORDER — SODIUM CHLORIDE 0.9% FLUSH: 3.0000 mL | INTRAVENOUS | Status: DC | PRN

## 2023-11-17 MED ORDER — ACETAMINOPHEN 325 MG PO TABS: 650.0000 mg | ORAL_TABLET | ORAL | Status: DC | PRN

## 2023-11-17 MED ORDER — SOD CITRATE-CITRIC ACID 500-334 MG/5ML PO SOLN: 30.0000 mL | ORAL | Status: DC | PRN

## 2023-11-17 MED ORDER — EPHEDRINE 5 MG/ML INJ: 10.0000 mg | INTRAVENOUS | Status: DC | PRN

## 2023-11-17 MED ORDER — LIDOCAINE HCL (PF) 1 % IJ SOLN: 30.0000 mL | INTRAMUSCULAR | Status: DC | PRN

## 2023-11-17 MED ORDER — LACTATED RINGERS IV SOLN: INTRAVENOUS | Status: DC

## 2023-11-17 MED ORDER — LACTATED RINGERS IV SOLN
500.0000 mL | Freq: Once | INTRAVENOUS | Status: AC
  Administered 2023-11-17: 500 mL via INTRAVENOUS

## 2023-11-17 MED ORDER — OXYTOCIN-SODIUM CHLORIDE 30-0.9 UT/500ML-% IV SOLN
1.0000 m[IU]/min | INTRAVENOUS | Status: DC
  Administered 2023-11-17: 2 m[IU]/min via INTRAVENOUS
  Filled 2023-11-17 (×2): qty 500

## 2023-11-17 MED ORDER — TERBUTALINE SULFATE 1 MG/ML IJ SOLN: 0.2500 mg | Freq: Once | INTRAMUSCULAR | Status: DC | PRN

## 2023-11-17 MED ORDER — LEVONORGESTREL 20 MCG/DAY IU IUD
1.0000 | INTRAUTERINE_SYSTEM | Freq: Once | INTRAUTERINE | Status: AC
  Administered 2023-11-18: 1 via INTRAUTERINE
  Filled 2023-11-17: qty 1

## 2023-11-17 MED ORDER — SODIUM CHLORIDE 0.9% FLUSH: 3.0000 mL | Freq: Two times a day (BID) | INTRAVENOUS | Status: DC

## 2023-11-17 MED ORDER — SODIUM CHLORIDE 0.9 % IV SOLN: 250.0000 mL | INTRAVENOUS | Status: DC | PRN

## 2023-11-17 MED ORDER — FENTANYL-BUPIVACAINE-NACL 0.5-0.125-0.9 MG/250ML-% EP SOLN
12.0000 mL/h | EPIDURAL | Status: DC | PRN
  Administered 2023-11-17: 12 mL/h via EPIDURAL
  Filled 2023-11-17: qty 250

## 2023-11-17 NOTE — Anesthesia Procedure Notes (Signed)
 Epidural Patient location during procedure: OB Start time: 11/17/2023 5:23 PM End time: 11/17/2023 5:27 PM  Staffing Anesthesiologist: Mallory Manus, MD Performed: other anesthesia staff   Preanesthetic Checklist Completed: patient identified, IV checked, site marked, risks and benefits discussed, surgical consent, monitors and equipment checked, pre-op evaluation and timeout performed  Epidural Patient position: sitting Prep: DuraPrep and site prepped and draped Patient monitoring: continuous pulse ox and blood pressure Approach: midline Location: L4-L5 Injection technique: LOR air  Needle:  Needle type: Tuohy  Needle gauge: 17 G Needle length: 9 cm and 9 Needle insertion depth: 6 cm Catheter type: closed end flexible Catheter size: 19 Gauge Catheter at skin depth: 10 cm Test dose: negative  Assessment Events: blood not aspirated, no cerebrospinal fluid, injection not painful, no injection resistance, no paresthesia and negative IV test

## 2023-11-17 NOTE — Progress Notes (Addendum)
 Maureen Morris is a 28 y.o. G7P1051 at [redacted]w[redacted]d by ultrasound admitted for SOL with back pain and vaginal pressure.  Subjective: Patient says she is feeling well and has no concerns at this time.  Objective: BP 129/85 (BP Location: Right Arm)   Pulse 85   Temp 98.1 F (36.7 C) (Oral)   Resp 17   Ht 5' 7 (1.702 m)   Wt 109.3 kg   LMP 02/19/2023   SpO2 100%   BMI 37.75 kg/m  No intake/output data recorded. No intake/output data recorded.  FHT:  FHR: 135 bpm, variability: moderate,  accelerations:  Present,  decelerations:  Absent UC:   Rregular, every 2-3 minutes, lasting about 1 minute Intermittent tracing. Numbers are based on last 20-minute reading.  SVE:   Dilation: 3 Effacement (%): 60 Station: -2 Exam by:: Mady Pals, RN  Labs: Lab Results  Component Value Date   WBC 9.2 11/17/2023   HGB 11.3 (L) 11/17/2023   HCT 33.9 (L) 11/17/2023   MCV 73.4 (L) 11/17/2023   PLT 288 11/17/2023    Assessment / Plan: Spontaneous labor, progressing normally  Labor: Expectant management at this time per patient preference Preeclampsia:  no signs or symptoms of toxicity Fetal Wellbeing:  Category I Pain Control:  Labor support without medications Anticipated MOD:  NSVD  Burnard LITTIE Rummer, Student-PA 11/17/2023, 3:56 PM  Attestation of Supervision of Resident: Evaluation and management procedures were performed by the learners: PA Student under my supervision. I was immediately available for direct supervision, assistance and direction throughout this encounter.  I also confirm that I have verified the information documented in the resident's note, and that I have also personally reperformed the pertinent components of the physical exam and all of the medical decision making activities.  I have also made any necessary editorial changes.  Charlie DELENA Courts, MD FMOB Fellow, Faculty Practice [facility], Center for [facility]

## 2023-11-17 NOTE — MAU Note (Signed)
 Maureen Morris is a 28 y.o. at [redacted]w[redacted]d here in MAU reporting: LOF noted when awoken this am- clear vaginal pressure. Report of back pain NO c/o vaginal bleeding, contractions, HA, chest pain, or visual disturbances. +FM   LMP: Onset of complaint: in bed this am Pain score: 7/10 Vitals:   11/17/23 1251  BP: 129/85  Pulse: 85  Resp: 17  Temp: 98.1 F (36.7 C)  SpO2: 100%     FHT: to room   Lab orders placed from triage: fern

## 2023-11-17 NOTE — Progress Notes (Signed)
 [name] is a 28 y.o. [phone] female at [redacted]w[redacted]d weeks gestation presenting to MAU with complaints of LOF. MAU provider was requested by RN to verify vertex presentation.  NST - FHR: 135 bpm / moderate variability / accels present / decels absent / TOCO: occasional  Patient informed that the ultrasound is considered a limited OB ultrasound and is not intended to be a complete ultrasound exam.  Patient also informed that the ultrasound is not being completed with the intent of assessing for fetal or placental anomalies or any pelvic abnormalities.  Explained that the purpose of today's ultrasound is to assess for presentation.  Baby was found to be in a vertex presentation. Patient acknowledges the purpose of the exam and the limitations of the study.  Maureen Morris, CNM  11/17/2023 1:28 PM

## 2023-11-17 NOTE — H&P (Addendum)
 OBSTETRIC ADMISSION HISTORY AND PHYSICAL  Maureen Morris is a 28 y.o. female [phone] with IUP at [redacted]w[redacted]d by US  at [redacted]w[redacted]d presenting for SOL with back pain and vaginal pressure. She reports +FMs, No LOF, no VB, no blurry vision, headaches or peripheral edema, and RUQ pain.  She plans on breast feeding. She request Mirena  IUD for birth control. She received her prenatal care at [facility]   Dating: By US  --->  Estimated Date of Delivery: 12/02/23  Sono: @[redacted]w[redacted]d , CWD, normal anatomy, cephalic presentation, posterior lie, 2327g, 43% EFW  Prenatal History/Complications: HIGH RISK PREGNANCY - Trich infection in pregnancy (treated) - Tobacco use during pregnancy - Sickle cell trait - Alpha thalassemia silent carrier - Anxiety - Depression  Past Medical History: Past Medical History:  Diagnosis Date   Anxiety    Depression    Eczema    Late prenatal care complicating pregnancy 04/18/2012   Medical history non-contributory    Pregnancy, supervision for, high-risk 04/24/2012   Clinic:Low Risk         Genetic Screen    NT:                            First Screen:               Quad Screen/MSAFP:      Anatomic US     Need f/u sono in 2 weeks for incomplete anatomy (04/20/12)      Glucose Screen    Normal 77      GBS           Feeding Preference           Contraception    Mirena       Circumcision    Yes if its a boy              SVD (spontaneous vaginal delivery) 08/15/2012   UTI (urinary tract infection)     Past Surgical History: Past Surgical History:  Procedure Laterality Date   THERAPEUTIC ABORTION     4 elective    Obstetrical History: OB History     Gravida  7   Para  1   Term  1   Preterm      AB  5   Living  1      SAB  2   IAB  3   Ectopic      Multiple      Live Births  1        Obstetric Comments  4 elective AB- procedures, no complications         Social History Social History   Socioeconomic History   Marital status: Single    Spouse name: Not  on file   Number of children: Not on file   Years of education: Not on file   Highest education level: Not on file  Occupational History   Not on file  Tobacco Use   Smoking status: Former    Current packs/day: 0.00    Types: Cigarettes    Quit date: 11/02/2014    Years since quitting: 9.0   Smokeless tobacco: Never   Tobacco comments:    Stopped with +preg  test  Vaping Use   Vaping status: Never Used  Substance and Sexual Activity   Alcohol use: Not Currently    Comment: Once a week.    Drug use: Not Currently    Types: Marijuana    Comment: stopped with  preg, early Oct   Sexual activity: Not Currently    Birth control/protection: None, Surgical, I.U.D.    Comment: would like tubes tied or BTL  Other Topics Concern   Not on file  Social History Narrative   Not on file   Social Drivers of Health   Financial Resource Strain: Not on file  Food Insecurity: No Food Insecurity (11/17/2023)   Hunger Vital Sign    Worried About Running Out of Food in the Last Year: Never true    Ran Out of Food in the Last Year: Never true  Transportation Needs: No Transportation Needs (11/17/2023)   PRAPARE - Administrator, Civil Service (Medical): No    Lack of Transportation (Non-Medical): No  Physical Activity: Not on file  Stress: Not on file  Social Connections: Not on file    Family History: Family History  Problem Relation Age of Onset   Hypertension Mother    Diabetes Mother    Hypertension Father    Anxiety disorder Sister    ADD / ADHD Brother    Bipolar disorder Brother    Schizophrenia Brother    Autism Brother    Sickle cell anemia Daughter    Breast cancer Maternal Grandmother     Allergies: No Known Allergies  Medications Prior to Admission  Medication Sig Dispense Refill Last Dose/Taking   aspirin 81 MG chewable tablet Chew by mouth daily.   11/16/2023   Prenatal Vit-Fe Fumarate-FA (PRENATAL MULTIVITAMIN) TABS tablet Take 1 tablet by mouth  daily at 12 noon.   11/16/2023   metroNIDAZOLE  (FLAGYL ) 500 MG tablet Take 1 tablet (500 mg total) by mouth 2 (two) times daily. 14 tablet 0 Unknown   ondansetron  (ZOFRAN ) 4 MG tablet Take 2 tablets (8 mg total) by mouth 2 (two) times daily. 20 tablet 0 More than a month     Review of Systems   All systems reviewed and negative except as stated in HPI  Blood pressure 129/85, pulse 85, temperature 98.1 F (36.7 C), temperature source Oral, resp. rate 17, height 5' 7 (1.702 m), weight 241 lb (109.3 kg), last menstrual period 02/19/2023, SpO2 100%, unknown if currently breastfeeding. General appearance: alert, cooperative, no distress, and moderately obese Lungs: clear to auscultation bilaterally Heart: regular rate and rhythm Abdomen: soft, non-tender; bowel sounds normal Pelvic: As below Extremities: Homans sign is negative, no sign of DVT Presentation: cephalic Fetal monitoring Baseline: 135 bpm, Variability: Good {> 6 bpm), Accelerations: Reactive, and Decelerations: Absent Uterine activity Frequency: Every 2-4 minutes and Duration: 60 seconds Dilation: 3 Effacement (%): 60 Station: -2 Exam by:: Mady Pals, RN Prenatal labs: ABO, Rh: --/--/PENDING (10/16 1344) Antibody: PENDING (10/16 1344) Rubella:   RPR: NON REACTIVE (10/20 1212)  HBsAg:    HIV: Non Reactive (03/18 0025)  GBS: Negative/-- (10/06 0000)    Lab Results  Component Value Date   GBS Negative 11/07/2023   GTT WNL Genetic screening  WNL Anatomy US  WNL  Immunization History  Administered Date(s) Administered   Influenza Split 04/18/2012   Tdap 08/14/2012    Prenatal Transfer Tool  Maternal Diabetes: No Genetic Screening: Normal Maternal Ultrasounds/Referrals: Normal Fetal Ultrasounds or other Referrals:  Referred to Materal Fetal Medicine  Maternal Substance Abuse:  No Significant Maternal Medications:  None Significant Maternal Lab Results: Group B Strep negative Number of Prenatal  Visits:greater than 3 verified prenatal visits Maternal Vaccinations:TDap Other Comments:  None   Results for orders placed or performed during the hospital encounter  of 11/17/23 (from the past 24 hours)  Fern Test   Collection Time: 11/17/23  1:19 PM  Result Value Ref Range   POCT Fern Test Positive = ruptured amniotic membanes   CBC   Collection Time: 11/17/23  1:44 PM  Result Value Ref Range   WBC 9.2 4.0 - 10.5 K/uL   RBC 4.62 3.87 - 5.11 MIL/uL   Hemoglobin 11.3 (L) 12.0 - 15.0 g/dL   HCT 66.0 (L) 63.9 - 53.9 %   MCV 73.4 (L) 80.0 - 100.0 fL   MCH 24.5 (L) 26.0 - 34.0 pg   MCHC 33.3 30.0 - 36.0 g/dL   RDW 85.7 88.4 - 84.4 %   Platelets 288 150 - 400 K/uL   nRBC 0.0 0.0 - 0.2 %  Type and screen [facility]   Collection Time: 11/17/23  1:44 PM  Result Value Ref Range   ABO/RH(D) PENDING    Antibody Screen PENDING    Sample Expiration      11/20/2023,2359 Performed at [facility], [address] [place], [place] [address]     Patient Active Problem List   Diagnosis Date Noted   Premature rupture of membranes 11/17/2023   Alpha thalassemia silent carrier 10/21/2023   Sickle cell trait in mother affecting pregnancy 10/21/2023   Supervision of other normal pregnancy, antepartum 05/18/2023   Trichomoniasis 07/20/2015   Biological false positive RPR test 06/22/2015   Sinus bradycardia 08/15/2012    Assessment/Plan:  Maureen Morris is a 28 y.o. G7P1051 at [redacted]w[redacted]d here for SOL.   #Labor: Would like to be expectantly managed but is open to pitocin  if needed later #Pain: Epidural when ready #FWB: Cat 1 #GBS status:  Negative  #Trich infection in pregnancy - Patient and partner treated March 2025  #Tobacco use during pregnancy - Quit in January after discovering she was pregnant - Continued abstinence  #Sickle cell trait #Alpha thalassemia silent carrier - FOB not carrier for either  - Low risk   #Anxiety #Depression - Plan for  PPB 1 week follow up  #Feeding: Breastmilk  #Reproductive Life planning: PP IUD Mirena  but ultimately wants BTL but did not sign paperwork #Circ:  yes  Charlie DELENA Courts, MD  11/17/2023, 3:09 PM

## 2023-11-18 ENCOUNTER — Encounter (HOSPITAL_COMMUNITY): Payer: Self-pay | Admitting: Obstetrics and Gynecology

## 2023-11-18 DIAGNOSIS — Z3A37 37 weeks gestation of pregnancy: Secondary | ICD-10-CM | POA: Diagnosis not present

## 2023-11-18 DIAGNOSIS — O4202 Full-term premature rupture of membranes, onset of labor within 24 hours of rupture: Secondary | ICD-10-CM

## 2023-11-18 DIAGNOSIS — Z30430 Encounter for insertion of intrauterine contraceptive device: Secondary | ICD-10-CM | POA: Diagnosis not present

## 2023-11-18 LAB — RPR: RPR Ser Ql: NONREACTIVE

## 2023-11-18 MED ORDER — ACETAMINOPHEN 325 MG PO TABS: 650.0000 mg | ORAL_TABLET | ORAL | Status: DC | PRN

## 2023-11-18 MED ORDER — METHYLERGONOVINE MALEATE 0.2 MG/ML IJ SOLN: 0.2000 mg | INTRAMUSCULAR | Status: DC | PRN

## 2023-11-18 MED ORDER — DIBUCAINE (PERIANAL) 1 % EX OINT: 1.0000 | TOPICAL_OINTMENT | CUTANEOUS | Status: DC | PRN

## 2023-11-18 MED ORDER — MEDROXYPROGESTERONE ACETATE 150 MG/ML IM SUSP: 150.0000 mg | INTRAMUSCULAR | Status: DC | PRN

## 2023-11-18 MED ORDER — BENZOCAINE-MENTHOL 20-0.5 % EX AERO
1.0000 | INHALATION_SPRAY | CUTANEOUS | Status: DC | PRN
  Administered 2023-11-18: 1 via TOPICAL
  Filled 2023-11-18: qty 56

## 2023-11-18 MED ORDER — ONDANSETRON HCL 4 MG/2ML IJ SOLN: 4.0000 mg | INTRAMUSCULAR | Status: DC | PRN

## 2023-11-18 MED ORDER — FLEET ENEMA RE ENEM: 1.0000 | ENEMA | Freq: Every day | RECTAL | Status: DC | PRN

## 2023-11-18 MED ORDER — MEASLES, MUMPS & RUBELLA VAC ~~LOC~~ SUSR: 0.5000 mL | Freq: Once | SUBCUTANEOUS | Status: DC

## 2023-11-18 MED ORDER — COCONUT OIL OIL
1.0000 | TOPICAL_OIL | Status: DC | PRN
  Administered 2023-11-18: 1 via TOPICAL

## 2023-11-18 MED ORDER — ONDANSETRON HCL 4 MG PO TABS: 4.0000 mg | ORAL_TABLET | ORAL | Status: DC | PRN

## 2023-11-18 MED ORDER — IBUPROFEN 600 MG PO TABS
600.0000 mg | ORAL_TABLET | Freq: Four times a day (QID) | ORAL | Status: DC
  Administered 2023-11-18 – 2023-11-19 (×3): 600 mg via ORAL
  Filled 2023-11-18 (×3): qty 1

## 2023-11-18 MED ORDER — WITCH HAZEL-GLYCERIN EX PADS: 1.0000 | MEDICATED_PAD | CUTANEOUS | Status: DC | PRN

## 2023-11-18 MED ORDER — METHYLERGONOVINE MALEATE 0.2 MG PO TABS: 0.2000 mg | ORAL_TABLET | ORAL | Status: DC | PRN

## 2023-11-18 MED ORDER — TETANUS-DIPHTH-ACELL PERTUSSIS 5-2-15.5 LF-MCG/0.5 IM SUSP
0.5000 mL | Freq: Once | INTRAMUSCULAR | Status: AC
  Administered 2023-11-19: 0.5 mL via INTRAMUSCULAR
  Filled 2023-11-18: qty 0.5

## 2023-11-18 MED ORDER — FERROUS SULFATE 325 (65 FE) MG PO TABS: 325.0000 mg | ORAL_TABLET | ORAL | Status: DC

## 2023-11-18 MED ORDER — BISACODYL 10 MG RE SUPP: 10.0000 mg | Freq: Every day | RECTAL | Status: DC | PRN

## 2023-11-18 MED ORDER — SENNOSIDES-DOCUSATE SODIUM 8.6-50 MG PO TABS
2.0000 | ORAL_TABLET | ORAL | Status: DC
  Filled 2023-11-18: qty 2

## 2023-11-18 MED ORDER — DIPHENHYDRAMINE HCL 25 MG PO CAPS: 25.0000 mg | ORAL_CAPSULE | Freq: Four times a day (QID) | ORAL | Status: DC | PRN

## 2023-11-18 MED ORDER — SIMETHICONE 80 MG PO CHEW: 80.0000 mg | CHEWABLE_TABLET | ORAL | Status: DC | PRN

## 2023-11-18 MED ORDER — PRENATAL MULTIVITAMIN CH
1.0000 | ORAL_TABLET | Freq: Every day | ORAL | Status: DC
  Administered 2023-11-19: 1 via ORAL
  Filled 2023-11-18: qty 1

## 2023-11-18 NOTE — Procedures (Signed)
 POST-PLACENTAL IUD INSERTION PROCEDURE NOTE Patient name: Maureen Morris MRN [id number]  Date of birth: 10-29-1995  The risks and benefits of the method and placement have been thouroughly reviewed with the patient and all questions were answered.  Specifically the patient is aware of failure rate of 02/998, expulsion of the IUD and of possible perforation.  The patient is aware of irregular bleeding due to the method and understands the incidence of irregular bleeding diminishes with time.   Signed copy of informed consent in chart.   Vaginal, labial and perineal areas thoroughly inspected for lacerations. Lacerations: n/a laceration identified and if needed was repaired prior to IUD insertion  Pt's IUD choice: Mirena   Time out was performed.   IUD inserted to fundus per manufacture's instructions.    Strings trimmed to the level of the introitus. Patient tolerated procedure well.  Patient given post procedure instructions and IUD care card with expiration date.  Patient is asked to keep IUD strings tucked in her vagina until her postpartum follow up visit in 4-6 weeks. Patient advised to abstain from sexual intercourse and pulling on strings before her follow-up visit. Patient verbalized an understanding of the plan of care and agrees.  charges entered.  Cathlean Ely CNM, WHNP-BC 11/18/2023 4:12 AM

## 2023-11-18 NOTE — Discharge Summary (Signed)
 Postpartum Discharge Summary  Date of Service updated***     Patient Name: Maureen Morris DOB: 15-Apr-1995 MRN: [id number]  Date of admission: 11/17/2023 Delivery date:11/18/2023 Delivering provider: NEWTON MERING Date of discharge: 11/18/2023  Admitting diagnosis: Premature rupture of membranes [O42.90] Intrauterine pregnancy: [redacted]w[redacted]d     Secondary diagnosis:  Principal Problem:   Premature rupture of membranes Active Problems:   Vaginal delivery   Encounter for IUD insertion  Additional problems: ***    Discharge diagnosis: Term Pregnancy Delivered                                              Post partum procedures:{Postpartum procedures:23558} Augmentation: Pitocin  Complications: None  Hospital course: Onset of Labor With Vaginal Delivery      28 y.o. yo [id number] at [redacted]w[redacted]d was admitted in Latent Labor on 11/17/2023. Labor course was complicated bynothing  Membrane Rupture Time/Date: 11:00 AM,11/17/2023  Delivery Method:Vaginal, Spontaneous Operative Delivery:N/A Episiotomy: None Lacerations:  None Patient had a postpartum course complicated by ***.  She is ambulating, tolerating a regular diet, passing flatus, and urinating well. Patient is discharged home in stable condition on 11/18/23.  Newborn Data: Birth date:11/18/2023 Birth time:3:36 AM Gender:Female Living status:Living Apgars:9 ,9  Weight:   Magnesium Sulfate received: No BMZ received: No Rhophylac:{Rhophylac received:30440032} FFM:{FFM:69559966} T-DaP:{Tdap:23962} Flu: {Qol:76036} RSV Vaccine received: {RSV:31013} Transfusion:{Transfusion received:30440034}  Immunizations received: Immunization History  Administered Date(s) Administered   Influenza Split 04/18/2012   Tdap 08/14/2012    Physical exam  Vitals:   11/18/23 0200 11/18/23 0230 11/18/23 0300 11/18/23 0347  BP: 115/89 127/81 138/78 123/84  Pulse: 79 77 70 (!) 112  Resp:      Temp:      TempSrc:      SpO2: 99%  99% 99%   Weight:      Height:       General: {Exam; general:21111117} Lochia: {Desc; appropriate/inappropriate:30686::appropriate} Uterine Fundus: {Desc; firm/soft:30687} Incision: {Exam; incision:21111123} DVT Evaluation: {Exam; dvt:2111122} Labs: Lab Results  Component Value Date   WBC 9.2 11/17/2023   HGB 11.3 (L) 11/17/2023   HCT 33.9 (L) 11/17/2023   MCV 73.4 (L) 11/17/2023   PLT 288 11/17/2023      Latest Ref Rng & Units 10/15/2023    1:44 AM  CMP  Glucose 70 - 99 mg/dL 94   BUN 6 - 20 mg/dL 7   Creatinine 9.55 - 8.99 mg/dL 9.41   Sodium 864 - 854 mmol/L 136   Potassium 3.5 - 5.1 mmol/L 3.9   Chloride 98 - 111 mmol/L 106   CO2 22 - 32 mmol/L 17   Calcium 8.9 - 10.3 mg/dL 8.8   Total Protein 6.5 - 8.1 g/dL 6.2   Total Bilirubin 0.0 - 1.2 mg/dL 0.3   Alkaline Phos 38 - 126 U/L 130   AST 15 - 41 U/L 19   ALT 0 - 44 U/L 24    Edinburgh Score:     No data to display         No data recorded  After visit meds:  Allergies as of 11/18/2023   No Known Allergies   Med Rec must be completed prior to using this [facility]***        Discharge home in stable condition Infant Feeding: {Baby feeding:23562} Infant Disposition:{CHL IP OB HOME WITH FNUYZM:76418} Discharge instruction: per After Visit Summary and  Postpartum booklet. Activity: Advance as tolerated. Pelvic rest for 6 weeks.  Diet: {OB ipzu:78888878} Future Appointments: Future Appointments  Date Time Provider Department Center  11/21/2023 11:15 AM [facility] PROVIDER 1 [facility]  11/21/2023 11:30 AM [facility] US1 [facility]   Follow up Visit:   Please schedule this patient for a In person postpartum visit in 4 weeks with the following provider: Any provider. Additional Postpartum F/U:IUD strings trimmed  Low risk pregnancy complicated by:  Delivery mode:  Vaginal, Spontaneous Anticipated Birth Control:  PP IUD placed   11/18/2023 Cathlean Ely, CNM

## 2023-11-18 NOTE — Progress Notes (Signed)
 At this time, this RN tried to call OB anaesthesiologist to chart an epidural catheter was placed. Dr. Dorethia stated that he cannot chart an epidural catheter and he cannot help me.

## 2023-11-18 NOTE — Anesthesia Preprocedure Evaluation (Signed)
 Anesthesia Evaluation  Patient identified by MRN, date of birth, ID band Patient awake    Reviewed: Allergy & Precautions, NPO status , Patient's Chart, lab work & pertinent test results  Airway Mallampati: III  TM Distance: >3 FB Neck ROM: Full    Dental no notable dental hx. (+) Teeth Intact, Dental Advisory Given   Pulmonary former smoker   Pulmonary exam normal breath sounds clear to auscultation       Cardiovascular negative cardio ROS Normal cardiovascular exam Rhythm:Regular Rate:Normal     Neuro/Psych  negative psych ROS   GI/Hepatic negative GI ROS, Neg liver ROS,,,  Endo/Other    Renal/GU negative Renal ROS     Musculoskeletal   Abdominal   Peds  Hematology Lab Results      Component                Value               Date                      WBC                      9.2                 11/17/2023                HGB                      11.3 (L)            11/17/2023                HCT                      33.9 (L)            11/17/2023                MCV                      73.4 (L)            11/17/2023                PLT                      288                 11/17/2023              Anesthesia Other Findings   Reproductive/Obstetrics (+) Pregnancy                              Anesthesia Physical Anesthesia Plan  ASA: 3  Anesthesia Plan: Epidural   Post-op Pain Management:    Induction:   PONV Risk Score and Plan:   Airway Management Planned:   Additional Equipment:   Intra-op Plan:   Post-operative Plan:   Informed Consent: I have reviewed the patients History and Physical, chart, labs and discussed the procedure including the risks, benefits and alternatives for the proposed anesthesia with the patient or authorized representative who has indicated his/her understanding and acceptance.       Plan Discussed with:   Anesthesia Plan Comments:          Anesthesia Quick Evaluation

## 2023-11-18 NOTE — Anesthesia Postprocedure Evaluation (Signed)
 Anesthesia Post Note  Patient: REMINGTON SKALSKY  Procedure(s) Performed: AN AD HOC LABOR EPIDURAL     Anesthetic complications: no   No notable events documented.  Last Vitals:  Vitals:   11/18/23 0500 11/18/23 0527  BP: 137/74 (P) 123/74  Pulse: 64 (P) 79  Resp:    Temp:  (P) 37.3 C  SpO2:  (P) 99%    Last Pain:  Vitals:   11/18/23 0500  TempSrc:   PainSc: 0-No pain   Pain Goal:                Epidural/Spinal Function Cutaneous sensation: Able to Wiggle Toes (11/18/23 0500), Patient able to flex knees: Yes (11/18/23 0500), Patient able to lift hips off bed: Yes (11/18/23 0500), Back pain beyond tenderness at insertion site: No (11/18/23 0500), Progressively worsening motor and/or sensory loss: No (11/18/23 0500), Bowel and/or bladder incontinence post epidural: No (11/18/23 0500)  Mitsuye Schrodt

## 2023-11-18 NOTE — Lactation Note (Signed)
 This note was copied from a baby's chart. Lactation Consultation Note  Patient Name: Maureen Morris Date: 11/18/2023 Age:30 hours Reason for consult: Initial assessment;Early term 37-38.6wks. See MOB: MR-hx tobacco use in pregnancy and sickle cell trait.   P2, Per MOB, infant is latching well at the breast, feedings are 10 minutes in length. MOB is experienced with breastfeeding see maternal data below. LC did not observe latch due to infant recently breastfeeding for 10 minutes at 1600 pm. MOB does not have any questions or concerns for LC at this time. MOB will continue to breastfeed infant by cues, on demand, 8-12 times within 24 hours, skin to skin.  LC taught hand expression using breast model and MOB taught back and expressed few drops of colostrum. LC discussed the importance of maternal rest, meals and hydration. MOB knows she can call LC services if questions arise or she needs latch assistance. MOB was  made aware of O/P services, breastfeeding support groups, community resources, and our phone # for post-discharge questions.    Maternal Data Has patient been taught Hand Expression?: Yes Does the patient have breastfeeding experience prior to this delivery?: Yes How long did the patient breastfeed?: Per MOB, she breastfeed her first child for 14 months.  Feeding Mother's Current Feeding Choice: Breast Milk  LATCH Score LC did not observe latch due infant recently breastfeeding for 10 minutes at 1600 pm and currently asleep in the basinet.                    Lactation Tools Discussed/Used    Interventions Interventions: Breast feeding basics reviewed;Skin to skin;Hand express;DEBP;Education;Guidelines for Milk Supply and Pumping Schedule Handout;LC Services brochure;CDC milk storage guidelines;CDC Guidelines for Breast Pump Cleaning  Discharge Pump: DEBP;Personal  Consult Status Consult Status: Follow-up Date: 11/19/23 Follow-up type:  In-patient    Grayce LULLA Batter 11/18/2023, 5:20 PM

## 2023-11-19 MED ORDER — IBUPROFEN 600 MG PO TABS: 600.0000 mg | ORAL_TABLET | Freq: Four times a day (QID) | ORAL | 0 refills | Status: AC | PRN

## 2023-11-19 MED ORDER — ACETAMINOPHEN 500 MG PO TABS: 1000.0000 mg | ORAL_TABLET | Freq: Four times a day (QID) | ORAL | Status: AC | PRN

## 2023-11-19 MED ORDER — SENNOSIDES-DOCUSATE SODIUM 8.6-50 MG PO TABS: 2.0000 | ORAL_TABLET | Freq: Every evening | ORAL | 2 refills | Status: AC | PRN

## 2023-11-19 MED ORDER — FERROUS SULFATE 325 (65 FE) MG PO TABS: 325.0000 mg | ORAL_TABLET | ORAL | 3 refills | Status: AC
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
## 2023-11-19 NOTE — Progress Notes (Signed)
 Post Partum Day 1 Subjective: No complaints, up ad lib, voiding, tolerating PO, and + flatus. Breastfeeding.  Baby is stable at bedside.  Objective: Blood pressure 122/79, pulse (!) 56, temperature 98.3 F (36.8 C), temperature source Oral, resp. rate 18, height 5' 7 (1.702 m), weight 109.3 kg, last menstrual period 02/19/2023, SpO2 100%, unknown if currently breastfeeding.  Physical Exam:  General: alert and no distress Lochia: appropriate Uterine Fundus: firm, NT DVT Evaluation: No evidence of DVT seen on physical exam.  Recent Labs    11/17/23 1344  HGB 11.3*  HCT 33.9*    Assessment/Plan: Breastfeeding, Lactation consult, and Contraception OP Mirena  Desires to go home if pediatrics will let baby go.  Discharge orders placed. Routine postpartum care until discharge.   LOS: 2 days   Gloris Hugger, MD 11/19/2023, 11:54 AM

## 2023-11-19 NOTE — Lactation Note (Signed)
 This note was copied from a baby's chart. Lactation Consultation Note  Patient Name: Maureen Morris Date: 11/19/2023 Age:30 hours Reason for consult: Follow-up assessment;Early term 12-38.6wks  P2, Mother does not have questions or concerns at this time. Reviewed engorgement care and monitoring voids/stools. She states she has two pumps at home and feel confident about breastfeeding.    Maternal Data Does the patient have breastfeeding experience prior to this delivery?: Yes  Feeding Mother's Current Feeding Choice: Breast Milk   Interventions Interventions: Education  Discharge Discharge Education: Engorgement and breast care;Warning signs for feeding baby Pump: Personal;Hands Free;DEBP  Consult Status Consult Status: Complete Date: 11/19/23    Shannon Dines [facility] 11/19/2023, 10:51 AM

## 2023-11-21 ENCOUNTER — Telehealth (HOSPITAL_COMMUNITY): Payer: Self-pay | Admitting: *Deleted

## 2023-11-21 ENCOUNTER — Ambulatory Visit

## 2023-11-21 DIAGNOSIS — Z1331 Encounter for screening for depression: Secondary | ICD-10-CM

## 2023-11-21 NOTE — Telephone Encounter (Signed)
 Hospital inpatient EPDS=10. Patient answered never to question #10. CSW consult completed during stay. Ambulatory IBH referral made now, per protocol, and Dr.Stinson notified via chart. Siedah Sedor, RN, 11/21/23, [phone]

## 2023-12-03 ENCOUNTER — Telehealth (HOSPITAL_COMMUNITY): Payer: Self-pay | Admitting: *Deleted

## 2023-12-03 NOTE — Telephone Encounter (Signed)
 12/03/2023  Name: Maureen Morris MRN: [id number] DOB: 08-15-1995  Reason for Call:  Transition of Care Hospital Discharge Call  Contact Status: Patient Contact Status: Complete  Language assistant needed:          Follow-Up Questions: Do You Have Any Concerns About Your Health As You Heal From Delivery?: No Do You Have Any Concerns About Your Infants Health?: No  Edinburgh Postnatal Depression Scale:  In the Past 7 Days: I have been able to laugh and see the funny side of things.: As much as I always could I have looked forward with enjoyment to things.: Rather less than I used to I have blamed myself unnecessarily when things went wrong.: Yes, some of the time I have been anxious or worried for no good reason.: Yes, very often I have felt scared or panicky for no good reason.: No, not much Things have been getting on top of me.: Yes, sometimes I haven't been coping as well as usual I have been so unhappy that I have had difficulty sleeping.: Yes, most of the time I have felt sad or miserable.: Yes, quite often I have been so unhappy that I have been crying.: Only occasionally The thought of harming myself has occurred to me.: Never Van Postnatal Depression Scale Total: (!) 15 Patient has Integrated Behavioral Health (IBH) appointment scheduled for 12/07/23. Verbalized plans to keep appointment. RN notified Dr. Nicholaus of patient's score via secure chat. Also notified IBH provider, so they would be aware of score at patient's scheduled appointment. Patient agreed for RN to email list of maternal mental health resources - emailed by RN. PHQ2-9 Depression Scale:     Discharge Follow-up: Edinburgh score requires follow up?: Yes Provider notified of Edinburgh score?: Yes Have you already been referred for a counseling appointment?: Yes Date of appointment:: 12/07/23 Any barriers for going to appointment?: None identified Patient was advised of the following resources::  Breastfeeding Support Group, Support Group Requested information - emailed by [employer] [employer].  Post-discharge interventions: Reviewed Newborn Safe Sleep Practices Maternal Mental Health Resources provided  Signature Allean IVAR Carton, RN, 12/03/23, 1004

## 2023-12-07 ENCOUNTER — Ambulatory Visit: Payer: Self-pay

## 2023-12-07 DIAGNOSIS — F4323 Adjustment disorder with mixed anxiety and depressed mood: Secondary | ICD-10-CM | POA: Diagnosis not present

## 2023-12-07 DIAGNOSIS — Z658 Other specified problems related to psychosocial circumstances: Secondary | ICD-10-CM

## 2023-12-07 NOTE — Patient Instructions (Addendum)
 [facility] [address] [place], [place] [address] [phone] (main office) [phone] [facility] office)  New Parent Support Groups [email or web address]  [employer] [email or web address]   Mother to Baby [email or web address]

## 2023-12-07 NOTE — BH Specialist Note (Signed)
 Integrated Behavioral Health via Telemedicine Visit  12/07/2023 Maureen Morris [id number]  Number of Integrated Behavioral Health Clinician visits: 1- Initial Visit  Session Start time: 1215   Session End time: 1253  Total time in minutes: 38  Referring Provider: Lang Peel, DO Patient/Family location: [place] Provider location: [facility]  All persons participating in visit: Patient [name] and [facility] [name]   Types of Service: Individual psychotherapy and Video visit  I connected with Lonita ONEIDA Pouch and/or Lonita ONEIDA Sovereign n/a via  Telephone or Video Enabled Telemedicine Application  (Video is Caregility application) and verified that I am speaking with the correct person using two identifiers. Discussed confidentiality: Yes   I discussed the limitations of telemedicine and the availability of in person appointments.  Discussed there is a possibility of technology failure and discussed alternative modes of communication if that failure occurs.  I discussed that engaging in this telemedicine visit, they consent to the provision of behavioral healthcare and the services will be billed under their insurance.  Patient and/or legal guardian expressed understanding and consented to Telemedicine visit: Yes   Presenting Concerns: Patient and/or family reports the following symptoms/concerns: Fatigue, poor appetite, anxiety, worry, difficulty relaxing and irritability; attributes to adjusting to new motherhood again in the midst of life stress. Pt's is interested to find work after a long break and reliable childcare, for short-term goal of making sure her 11yo daughter has a good Christmas. Pt is currently coping with a positive outlook, journal app, and counting to 10; open to implementing additional self-coping strategy; will discuss medication options at her upcoming [facility],  that is safe while breastfeeding.  Duration of problem: Perinatal/postpartum; Severity of problem: moderately severe  Patient and/or Family's Strengths/Protective Factors: Social connections, Concrete supports in place (healthy food, safe environments, etc.), and Sense of purpose  Goals Addressed: Patient will:  Reduce symptoms of: anxiety, depression, and stress   Increase knowledge and/or ability of: healthy habits, self-management skills, and stress reduction   Demonstrate ability to: Increase healthy adjustment to current life circumstances, Increase adequate support systems for patient/family, and Increase motivation to adhere to plan of care  Progress towards Goals: Ongoing    Interventions: Interventions utilized:  Mindfulness or Management Consultant, Psychoeducation and/or Health Education, Link to [employer], and Supportive Reflection Standardized Assessments completed: GAD-7 and PHQ 9   Patient and/or Family Response: Patient agrees with treatment plan.  Clinical Assessment/Diagnosis  No diagnosis found.   Patient may benefit from psychoeducation and brief therapeutic interventions regarding coping with symptoms of depression, anxiety, life stress   Plan: Follow up with behavioral health clinician on : Two weeks Behavioral recommendations:  -Continue taking iron as prescribed; discuss with medical provider at upcoming visit -Continue prioritizing healthy self-care (regular meals, adequate rest; allowing practical help from supportive friends and family) until at least postpartum medical appointment -Consider new mom support group as needed at either [email or web address] or [email or web address]  -Read through resources/information on After Visit Summary; use as needed and discussed -Accept referral to [facility]): Integrated Art Gallery Manager (In Clinic) and [employer] Resources:  Advice Worker; [employer]; [employer];  new mom support  I discussed the assessment and treatment plan with the patient and/or parent/guardian. They were provided an opportunity to ask questions and all were answered. They agreed with the plan and demonstrated an understanding of the instructions.   They were advised to  call back or seek an in-person evaluation if the symptoms worsen or if the condition fails to improve as anticipated.  Warren BROCKS Han Lysne, LCSW     12/07/2023    1:22 PM  Depression screen PHQ 2/9  Decreased Interest 1  Down, Depressed, Hopeless 1  PHQ - 2 Score 2  Altered sleeping 0  Tired, decreased energy 3  Change in appetite 3  Feeling bad or failure about yourself  3  Trouble concentrating 1  Moving slowly or fidgety/restless 0  Suicidal thoughts 0  PHQ-9 Score 12      12/07/2023    1:24 PM  GAD 7 : Generalized Anxiety Score  Nervous, Anxious, on Edge 3  Control/stop worrying 3  Worry too much - different things 3  Trouble relaxing 3  Restless 0  Easily annoyed or irritable 3  Afraid - awful might happen 1  Total GAD 7 Score 16        12/03/2023    9:51 AM 11/18/2023    6:00 PM 11/18/2023    2:00 PM 11/18/2023   12:00 PM 11/18/2023   10:00 AM  Edinburgh Postnatal Depression Scale Screening Tool  I have been able to laugh and see the funny side of things. 0 0  --  --  --   I have looked forward with enjoyment to things. 1 1      I have blamed myself unnecessarily when things went wrong. 2 2      I have been anxious or worried for no good reason. 3 2      I have felt scared or panicky for no good reason. 1 1      Things have been getting on top of me. 2 1      I have been so unhappy that I have had difficulty sleeping. 3 1      I have felt sad or miserable. 2 1      I have been so unhappy that I have been crying. 1 1      The thought of harming myself has occurred to me. 0 0      Edinburgh Postnatal Depression Scale Total 15 10        Data saved with a previous flowsheet row  definition

## 2023-12-23 NOTE — BH Specialist Note (Unsigned)
 Pt did not arrive to video visit and did not answer the phone; Left HIPPA-compliant message to call back Warren from [employer] for [employer] at [facility] at  [phone] [facility] office).  ?; left MyChart message for patient.  ? ?

## 2023-12-26 ENCOUNTER — Ambulatory Visit: Admitting: Clinical

## 2023-12-26 DIAGNOSIS — Z91199 Patient's noncompliance with other medical treatment and regimen due to unspecified reason: Secondary | ICD-10-CM
